# Patient Record
Sex: Female | Born: 1955 | Race: Black or African American | Hispanic: No | Marital: Married | Smoking: Former smoker
Health system: Southern US, Community
[De-identification: ages and names within clinical notes are randomized; demographics above are authoritative.]

## PROBLEM LIST (undated history)

## (undated) DIAGNOSIS — I10 Essential (primary) hypertension: Secondary | ICD-10-CM

## (undated) HISTORY — DX: Essential (primary) hypertension: I10

---

## 1998-03-26 ENCOUNTER — Ambulatory Visit (HOSPITAL_COMMUNITY): Admission: RE | Admit: 1998-03-26 | Discharge: 1998-03-26 | Payer: Self-pay | Admitting: Internal Medicine

## 1999-02-25 ENCOUNTER — Encounter (HOSPITAL_COMMUNITY): Admission: RE | Admit: 1999-02-25 | Discharge: 1999-05-26 | Payer: Self-pay | Admitting: Obstetrics

## 1999-02-25 ENCOUNTER — Other Ambulatory Visit: Admission: RE | Admit: 1999-02-25 | Discharge: 1999-02-25 | Payer: Self-pay | Admitting: Obstetrics

## 1999-03-29 ENCOUNTER — Inpatient Hospital Stay (HOSPITAL_COMMUNITY): Admission: AD | Admit: 1999-03-29 | Discharge: 1999-03-29 | Payer: Self-pay | Admitting: Obstetrics

## 2001-11-22 ENCOUNTER — Inpatient Hospital Stay (HOSPITAL_COMMUNITY): Admission: RE | Admit: 2001-11-22 | Discharge: 2001-11-26 | Payer: Self-pay | Admitting: Obstetrics

## 2001-11-22 ENCOUNTER — Encounter (INDEPENDENT_AMBULATORY_CARE_PROVIDER_SITE_OTHER): Payer: Self-pay | Admitting: Specialist

## 2003-04-18 ENCOUNTER — Ambulatory Visit (HOSPITAL_COMMUNITY): Admission: RE | Admit: 2003-04-18 | Discharge: 2003-04-18 | Payer: Self-pay | Admitting: Obstetrics

## 2003-04-18 ENCOUNTER — Encounter: Payer: Self-pay | Admitting: Obstetrics

## 2003-07-11 ENCOUNTER — Ambulatory Visit (HOSPITAL_COMMUNITY): Admission: RE | Admit: 2003-07-11 | Discharge: 2003-07-11 | Payer: Self-pay | Admitting: Gastroenterology

## 2003-07-11 ENCOUNTER — Encounter (INDEPENDENT_AMBULATORY_CARE_PROVIDER_SITE_OTHER): Payer: Self-pay | Admitting: *Deleted

## 2004-06-04 ENCOUNTER — Encounter (HOSPITAL_COMMUNITY): Admission: RE | Admit: 2004-06-04 | Discharge: 2004-09-02 | Payer: Self-pay | Admitting: Internal Medicine

## 2004-10-31 ENCOUNTER — Encounter: Admission: RE | Admit: 2004-10-31 | Discharge: 2004-10-31 | Payer: Self-pay | Admitting: Gastroenterology

## 2009-12-18 ENCOUNTER — Ambulatory Visit: Payer: Self-pay | Admitting: Cardiology

## 2009-12-18 ENCOUNTER — Encounter: Payer: Self-pay | Admitting: Internal Medicine

## 2009-12-18 ENCOUNTER — Ambulatory Visit (HOSPITAL_COMMUNITY): Admission: RE | Admit: 2009-12-18 | Discharge: 2009-12-18 | Payer: Self-pay | Admitting: Internal Medicine

## 2010-11-15 ENCOUNTER — Encounter: Payer: Self-pay | Admitting: Gastroenterology

## 2010-11-16 ENCOUNTER — Encounter: Payer: Self-pay | Admitting: Gastroenterology

## 2011-02-05 ENCOUNTER — Ambulatory Visit (INDEPENDENT_AMBULATORY_CARE_PROVIDER_SITE_OTHER): Payer: BLUE CROSS/BLUE SHIELD | Admitting: Family Medicine

## 2011-02-05 DIAGNOSIS — Z79899 Other long term (current) drug therapy: Secondary | ICD-10-CM

## 2011-02-05 DIAGNOSIS — K5190 Ulcerative colitis, unspecified, without complications: Secondary | ICD-10-CM

## 2011-02-05 DIAGNOSIS — I10 Essential (primary) hypertension: Secondary | ICD-10-CM

## 2011-02-05 DIAGNOSIS — H109 Unspecified conjunctivitis: Secondary | ICD-10-CM

## 2011-03-13 NOTE — Discharge Summary (Signed)
[facility]  Patient:    Virginia Robles, Virginia Robles Visit Number: [id number] MRN: [id number]          Service Type: GYN Location: [id number] Attending Physician:  Venita Sheffield Dictated by:   Kathreen Cosier, M.D. Admit Date:  11/22/2001 Discharge Date: 11/26/2001                             Discharge Summary  HISTORY OF PRESENT ILLNESS:   The patient is a 55 year old, gravida 1, para 1, with a long history of dysfunctional bleeding, had a D&C done in the past, hormonal treatment, she had myomas, and was admitted for TAH/BSO.  ALLERGIES:                    PENICILLIN and SULFA.  MEDICATIONS:                  Norvasc, Zestril, Toprol, hydrochlorothiazide for increased blood pressure.  LABORATORY DATA:              On admission her hemoglobin was 12.1, white count 11.3, platelets 378,000. Sodium 141, potassium 4.6, chloride 105, CO2 24, glucose 93. Urine negative. She was O positive.  HOSPITAL COURSE:              The patient underwent TAH/BSO. Postoperatively, she did well except for slight temperature elevation. She was started on Cleocin 900 mg IV every eight hours. Postoperatively, her hemoglobin was 10.9. The patient did well but was kept an extra day because her blood pressure went up to 160/104. She was discharged on the fourth postoperative day, ambulatory on a regular diet.  DISCHARGE FOLLOWUP:           The patient is to see Dr. Gaynell Face in four weeks.  DISCHARGE MEDICATIONS:        Her antihypertensives as prescribed plus Tylox one p.o. q.3-4h. p.r.n.  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 Dictated by:   Kathreen Cosier, M.D. Attending Physician:  Venita Sheffield DD:  12/08/01 TD:  12/08/01 Job: [id number]

## 2011-03-13 NOTE — Op Note (Signed)
NAME:  Virginia Robles, Virginia Robles                       ACCOUNT NO.:  [id number]   MEDICAL RECORD NO.:  [id number]                   PATIENT TYPE:  AMB   LOCATION:  ENDO                                 FACILITY:  [facility]   PHYSICIAN:  Anselmo Rod, M.D.               DATE OF BIRTH:  1956-07-31   DATE OF PROCEDURE:  07/11/2003  DATE OF DISCHARGE:                                 OPERATIVE REPORT   PROCEDURE PERFORMED:  Colonoscopy with multiple biopsies from the right,  transverse, and left colon.   ENDOSCOPIST:  Anselmo Rod, M.D.   INSTRUMENT USED:  Olympus video colonoscope.   INDICATION FOR PROCEDURE:  A 55 year old African-American female with a  history of ulcerative colitis presenting with rectal bleeding, worsening  diarrhea, undergoing a colonoscopy to evaluate the extent of disease.   PREPROCEDURE PREPARATION:  Informed consent was procured from the patient.  The patient was fasted for eight hours prior to the procedure and prepped  with a bottle of magnesium citrate and a gallon of GoLYTELY the night prior  to the procedure.   PREPROCEDURE PHYSICAL:  VITAL SIGNS:  The patient had stable vital signs.  NECK:  Supple.  CHEST:  Clear to auscultation.  S1, S2 regular.  ABDOMEN:  Soft with normal bowel sounds.   DESCRIPTION OF PROCEDURE:  The patient was placed in the left lateral  decubitus position and sedated with 50 mg of Demerol and 5 mg of Versed  intravenously.  Once the patient was adequately sedate and maintained on low-  flow oxygen and continuous cardiac monitoring, the Olympus video colonoscope  was advanced from the rectum to the cecum without difficulty.  The patient  had some residual stool in the rectosigmoid area, and multiple washes were  done.  The procedure was completed up to the terminal ileum.  The  appendiceal orifice and the ileocecal valve were clearly visualized and  photographed.  There was evidence of severe inflammatory change with  pseudopolyps  of varying sizes throughout the colon up to the cecum.  Multiple biopsies were done from the cecum, right colon, transverse colon,  and left colon.  Retroflexion in the rectum revealed no abnormalities.  The  patient tolerated the procedure well without complications.  The terminal  ileum appeared normal.   IMPRESSION:  1. 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  2. Normal-appearing terminal ileum.   RECOMMENDATIONS:  1. Await pathology results.  2.     Possible surgical evaluation once the pathology results have been procured.   3. Avoid nonsteroidals.  4. Outpatient follow-up in the next two weeks for further recommendation.  Anselmo Rod, M.D.    JNM/MEDQ  D:  07/11/2003  T:  07/12/2003  Job:  [id number]   cc:   Gabriel Earing, M.D.  [address]  [place]  [place] [address]  Fax: [phone]

## 2011-03-13 NOTE — Op Note (Signed)
[facility]  Patient:    Virginia Robles, Virginia Robles Visit Number: [id number] MRN: [id number]          Service Type: GYN Location: [phone] Attending Physician:  Venita Sheffield Dictated by:   Kathreen Cosier, M.D. Proc. Date: 11/22/01 Admit Date:  11/22/2001                             Operative Report  PREOPERATIVE DIAGNOSES:       1. Myomas.                               2. Dysfunctional uterine bleeding.  POSTOPERATIVE DIAGNOSES:      1. Myomas.                               2. Dysfunctional uterine bleeding.                               3. Multiple small bowel adhesions to the                                  anterior abdominal wall.  SURGEONS:                     1. Kathreen Cosier, M.D.                               2. Mardene Celeste. Lurene Shadow, M.D.  DESCRIPTION OF PROCEDURE:     The patient was placed on the operating table in the supine position.  General anesthesia was administered.  The abdomen was prepped and draped.  The bladder was emptied with a Foley catheter.  A transverse suprapubic incision was made and carried down to the rectus fascia. The fascia was cleanly incised for the length of the incision.  The rectus muscles were retracted laterally.  The peritoneum was incised longitudinally. It was noted that she had multiple myomas.  The ovaries and tubes were normal. he right round ligament was grasped with a Kelly clamp, cut through and ligated with #1 chromic.   The preoperative diagnosis was done in a similar fashion on the other side.  The right infundibulopelvic ligament was grasped with a Kelly, cut and suture ligated.  This was done in a similar fashion on the other side.  The bladder was dissected off of the cervix with Metzenbaum scissors.  The uterine vessels were skeletonized bilaterally, doubly clamped in the right with Heaney clamps, cut and suture ligated x 2.  This was done in a similar fashion on the  other side.  The cardinal and uterosacral ligaments were grasped with a straight Kocher on the right, cut and suture ligated with #1 chromic.  This was done in a similar fashion on the other side.  The specimen consisting of the uterus, tubes and ovaries was removed from the cervicovaginal junction.  Modified Richardson sutures were placed in the angles of the vagina, followed by an interlocking suture of #1 chromic.  The vault was left open.  The operative site was reperitonealized with 2-0 chromic.  Hemostasis was satisfactory.  On preparation for closure  of the abdominal incision, it was noted that she had a large amount of small bowel near the umbilicus.  This was attached to the old midline scar.  Using Metzenbaum scissors, these adhesions were lysed.  Any serosal tears were closed by Dr. Lurene Shadow wth 3-0 silk.  The abdomen was closed in layers, the peritoneum with continuous suture of 0 chromic, the fascia with a continuous suture of 0 Dexon and the skin closed with subcuticular suture of 3-0 plain. Blood loss was 150 cc.  The patient tolerated the procedure well and was taken to the recovery room in good condition. Dictated by:   Kathreen Cosier, M.D. Attending Physician:  Venita Sheffield DD:  11/22/01 TD:  11/22/01 Job: [id number]

## 2011-08-06 ENCOUNTER — Encounter: Payer: Self-pay | Admitting: Family Medicine

## 2011-08-25 ENCOUNTER — Other Ambulatory Visit (HOSPITAL_COMMUNITY): Payer: Self-pay | Admitting: Obstetrics

## 2011-08-25 DIAGNOSIS — Z8051 Family history of malignant neoplasm of kidney: Secondary | ICD-10-CM

## 2011-08-25 DIAGNOSIS — Z1231 Encounter for screening mammogram for malignant neoplasm of breast: Secondary | ICD-10-CM

## 2011-09-30 ENCOUNTER — Ambulatory Visit (HOSPITAL_COMMUNITY)
Admission: RE | Admit: 2011-09-30 | Discharge: 2011-09-30 | Disposition: A | Discharge status: Home / Self Care | Payer: BC Managed Care – PPO | Source: Ambulatory Visit | Attending: Obstetrics | Admitting: Obstetrics

## 2011-09-30 ENCOUNTER — Ambulatory Visit (HOSPITAL_COMMUNITY): Admission: RE | Admit: 2011-09-30 | Payer: BC Managed Care – PPO | Source: Ambulatory Visit

## 2011-09-30 DIAGNOSIS — Z1231 Encounter for screening mammogram for malignant neoplasm of breast: Secondary | ICD-10-CM

## 2012-11-14 ENCOUNTER — Other Ambulatory Visit (HOSPITAL_COMMUNITY): Payer: Self-pay | Admitting: Internal Medicine

## 2012-11-14 DIAGNOSIS — Z1231 Encounter for screening mammogram for malignant neoplasm of breast: Secondary | ICD-10-CM

## 2012-11-16 ENCOUNTER — Ambulatory Visit (HOSPITAL_COMMUNITY)
Admission: RE | Admit: 2012-11-16 | Discharge: 2012-11-16 | Disposition: A | Discharge status: Home / Self Care | Payer: BC Managed Care – PPO | Source: Ambulatory Visit | Attending: Internal Medicine | Admitting: Internal Medicine

## 2012-11-16 DIAGNOSIS — Z1231 Encounter for screening mammogram for malignant neoplasm of breast: Secondary | ICD-10-CM

## 2013-10-31 ENCOUNTER — Other Ambulatory Visit (HOSPITAL_COMMUNITY): Payer: Self-pay | Admitting: Internal Medicine

## 2013-10-31 DIAGNOSIS — Z1231 Encounter for screening mammogram for malignant neoplasm of breast: Secondary | ICD-10-CM

## 2013-11-21 ENCOUNTER — Ambulatory Visit (HOSPITAL_COMMUNITY)
Admission: RE | Admit: 2013-11-21 | Discharge: 2013-11-21 | Disposition: A | Discharge status: Home / Self Care | Payer: BC Managed Care – PPO | Source: Ambulatory Visit | Attending: Internal Medicine | Admitting: Internal Medicine

## 2013-11-21 DIAGNOSIS — Z1231 Encounter for screening mammogram for malignant neoplasm of breast: Secondary | ICD-10-CM

## 2014-11-05 ENCOUNTER — Ambulatory Visit (HOSPITAL_COMMUNITY): Payer: BC Managed Care – PPO

## 2014-11-05 ENCOUNTER — Other Ambulatory Visit (HOSPITAL_COMMUNITY): Payer: Self-pay | Admitting: Internal Medicine

## 2014-11-05 DIAGNOSIS — Z1231 Encounter for screening mammogram for malignant neoplasm of breast: Secondary | ICD-10-CM

## 2014-12-06 ENCOUNTER — Ambulatory Visit (HOSPITAL_COMMUNITY): Payer: BC Managed Care – PPO

## 2014-12-12 ENCOUNTER — Ambulatory Visit (HOSPITAL_COMMUNITY)
Admission: RE | Admit: 2014-12-12 | Discharge: 2014-12-12 | Disposition: A | Discharge status: Home / Self Care | Payer: BC Managed Care – PPO | Source: Ambulatory Visit | Attending: Internal Medicine | Admitting: Internal Medicine

## 2014-12-12 DIAGNOSIS — Z1231 Encounter for screening mammogram for malignant neoplasm of breast: Secondary | ICD-10-CM | POA: Diagnosis not present

## 2015-11-25 ENCOUNTER — Other Ambulatory Visit: Payer: Self-pay

## 2015-11-25 DIAGNOSIS — Z1231 Encounter for screening mammogram for malignant neoplasm of breast: Secondary | ICD-10-CM

## 2015-12-16 ENCOUNTER — Ambulatory Visit
Admission: RE | Admit: 2015-12-16 | Discharge: 2015-12-16 | Disposition: A | Discharge status: Home / Self Care | Payer: BC Managed Care – PPO | Source: Ambulatory Visit

## 2015-12-16 DIAGNOSIS — Z1231 Encounter for screening mammogram for malignant neoplasm of breast: Secondary | ICD-10-CM

## 2016-12-18 ENCOUNTER — Other Ambulatory Visit: Payer: Self-pay | Admitting: Internal Medicine

## 2016-12-18 DIAGNOSIS — Z1231 Encounter for screening mammogram for malignant neoplasm of breast: Secondary | ICD-10-CM

## 2017-01-06 ENCOUNTER — Ambulatory Visit
Admission: RE | Admit: 2017-01-06 | Discharge: 2017-01-06 | Disposition: A | Discharge status: Home / Self Care | Payer: BC Managed Care – PPO | Source: Ambulatory Visit | Attending: Internal Medicine | Admitting: Internal Medicine

## 2017-01-06 DIAGNOSIS — Z1231 Encounter for screening mammogram for malignant neoplasm of breast: Secondary | ICD-10-CM

## 2017-12-22 ENCOUNTER — Other Ambulatory Visit: Payer: Self-pay | Admitting: Internal Medicine

## 2017-12-22 DIAGNOSIS — Z1231 Encounter for screening mammogram for malignant neoplasm of breast: Secondary | ICD-10-CM

## 2018-01-07 ENCOUNTER — Ambulatory Visit: Payer: BC Managed Care – PPO

## 2018-01-14 ENCOUNTER — Ambulatory Visit
Admission: RE | Admit: 2018-01-14 | Discharge: 2018-01-14 | Disposition: A | Discharge status: Home / Self Care | Payer: BC Managed Care – PPO | Source: Ambulatory Visit | Attending: Internal Medicine | Admitting: Internal Medicine

## 2018-01-14 DIAGNOSIS — Z1231 Encounter for screening mammogram for malignant neoplasm of breast: Secondary | ICD-10-CM

## 2018-02-23 ENCOUNTER — Ambulatory Visit: Payer: BC Managed Care – PPO | Admitting: Family Medicine

## 2018-02-23 ENCOUNTER — Encounter: Payer: Self-pay | Admitting: Family Medicine

## 2018-02-23 VITALS — BP 142/80 | HR 102 | Temp 98.1°F | Wt 187.0 lb

## 2018-02-23 DIAGNOSIS — Z7689 Persons encountering health services in other specified circumstances: Secondary | ICD-10-CM

## 2018-02-23 DIAGNOSIS — I10 Essential (primary) hypertension: Secondary | ICD-10-CM

## 2018-02-23 NOTE — Patient Instructions (Addendum)
Today we discussed taking your lisinopril 40 mg tablets and breaking them in half.  This means you will start taking 20 mg (1/2 tab) daily.  You should continue checking her blood pressure at home.  If your blood pressure readings are greater than 140/80 consistently please feel free to contact the clinic so that we may send in a different blood pressure medication to your pharmacy.   DASH Eating Plan DASH stands for "Dietary Approaches to Stop Hypertension." The DASH eating plan is a healthy eating plan that has been shown to reduce high blood pressure (hypertension). It may also reduce your risk for type 2 diabetes, heart disease, and stroke. The DASH eating plan may also help with weight loss. What are tips for following this plan? General guidelines  Avoid eating more than 2,300 mg (milligrams) of salt (sodium) a day. If you have hypertension, you may need to reduce your sodium intake to 1,500 mg a day.  Limit alcohol intake to no more than 1 drink a day for nonpregnant women and 2 drinks a day for men. One drink equals 12 oz of beer, 5 oz of wine, or 1 oz of hard liquor.  Work with your health care provider to maintain a healthy body weight or to lose weight. Ask what an ideal weight is for you.  Get at least 30 minutes of exercise that causes your heart to beat faster (aerobic exercise) most days of the week. Activities may include walking, swimming, or biking.  Work with your health care provider or diet and nutrition specialist (dietitian) to adjust your eating plan to your individual calorie needs. Reading food labels  Check food labels for the amount of sodium per serving. Choose foods with less than 5 percent of the Daily Value of sodium. Generally, foods with less than 300 mg of sodium per serving fit into this eating plan.  To find whole grains, look for the word "whole" as the first word in the ingredient list. Shopping  Buy products labeled as "low-sodium" or "no salt  added."  Buy fresh foods. Avoid canned foods and premade or frozen meals. Cooking  Avoid adding salt when cooking. Use salt-free seasonings or herbs instead of table salt or sea salt. Check with your health care provider or pharmacist before using salt substitutes.  Do not fry foods. Cook foods using healthy methods such as baking, boiling, grilling, and broiling instead.  Cook with heart-healthy oils, such as olive, canola, soybean, or sunflower oil. Meal planning   Eat a balanced diet that includes: ? 5 or more servings of fruits and vegetables each day. At each meal, try to fill half of your plate with fruits and vegetables. ? Up to 6-8 servings of whole grains each day. ? Less than 6 oz of lean meat, poultry, or fish each day. A 3-oz serving of meat is about the same size as a deck of cards. One egg equals 1 oz. ? 2 servings of low-fat dairy each day. ? A serving of nuts, seeds, or beans 5 times each week. ? Heart-healthy fats. Healthy fats called Omega-3 fatty acids are found in foods such as flaxseeds and coldwater fish, like sardines, salmon, and mackerel.  Limit how much you eat of the following: ? Canned or prepackaged foods. ? Food that is high in trans fat, such as fried foods. ? Food that is high in saturated fat, such as fatty meat. ? Sweets, desserts, sugary drinks, and other foods with added sugar. ? Full-fat dairy  products.  Do not salt foods before eating.  Try to eat at least 2 vegetarian meals each week.  Eat more home-cooked food and less restaurant, buffet, and fast food.  When eating at a restaurant, ask that your food be prepared with less salt or no salt, if possible. What foods are recommended? The items listed may not be a complete list. Talk with your dietitian about what dietary choices are best for you. Grains Whole-grain or whole-wheat bread. Whole-grain or whole-wheat pasta. Brown rice. [place]. Bulgur. Whole-grain and low-sodium cereals.  Pita bread. Low-fat, low-sodium crackers. Whole-wheat flour tortillas. Vegetables Fresh or frozen vegetables (raw, steamed, roasted, or grilled). Low-sodium or reduced-sodium tomato and vegetable juice. Low-sodium or reduced-sodium tomato sauce and tomato paste. Low-sodium or reduced-sodium canned vegetables. Fruits All fresh, dried, or frozen fruit. Canned fruit in natural juice (without added sugar). Meat and other protein foods Skinless chicken or [place]. Ground chicken or [place]. Pork with fat trimmed off. Fish and seafood. Egg whites. Dried beans, peas, or lentils. Unsalted nuts, nut butters, and seeds. Unsalted canned beans. Lean cuts of beef with fat trimmed off. Low-sodium, lean deli meat. Dairy Low-fat (1%) or fat-free (skim) milk. Fat-free, low-fat, or reduced-fat cheeses. Nonfat, low-sodium ricotta or cottage cheese. Low-fat or nonfat yogurt. Low-fat, low-sodium cheese. Fats and oils Soft margarine without trans fats. Vegetable oil. Low-fat, reduced-fat, or light mayonnaise and salad dressings (reduced-sodium). Canola, safflower, olive, soybean, and sunflower oils. Avocado. Seasoning and other foods Herbs. Spices. Seasoning mixes without salt. Unsalted popcorn and pretzels. Fat-free sweets. What foods are not recommended? The items listed may not be a complete list. Talk with your dietitian about what dietary choices are best for you. Grains Baked goods made with fat, such as croissants, muffins, or some breads. Dry pasta or rice meal packs. Vegetables Creamed or fried vegetables. Vegetables in a cheese sauce. Regular canned vegetables (not low-sodium or reduced-sodium). Regular canned tomato sauce and paste (not low-sodium or reduced-sodium). Regular tomato and vegetable juice (not low-sodium or reduced-sodium). Angie Fava. Olives. Fruits Canned fruit in a light or heavy syrup. Fried fruit. Fruit in cream or butter sauce. Meat and other protein foods Fatty cuts of meat. Ribs. Fried  meat. Berniece Salines. Sausage. Bologna and other processed lunch meats. Salami. Fatback. Hotdogs. Bratwurst. Salted nuts and seeds. Canned beans with added salt. Canned or smoked fish. Whole eggs or egg yolks. Chicken or [place] with skin. Dairy Whole or 2% milk, cream, and half-and-half. Whole or full-fat cream cheese. Whole-fat or sweetened yogurt. Full-fat cheese. Nondairy creamers. Whipped toppings. Processed cheese and cheese spreads. Fats and oils Butter. Stick margarine. Lard. Shortening. Ghee. Bacon fat. Tropical oils, such as coconut, palm kernel, or palm oil. Seasoning and other foods Salted popcorn and pretzels. Onion salt, garlic salt, seasoned salt, table salt, and sea salt. Worcestershire sauce. Tartar sauce. Barbecue sauce. Teriyaki sauce. Soy sauce, including reduced-sodium. Steak sauce. Canned and packaged gravies. Fish sauce. Oyster sauce. Cocktail sauce. Horseradish that you find on the shelf. Ketchup. Mustard. Meat flavorings and tenderizers. Bouillon cubes. Hot sauce and Tabasco sauce. Premade or packaged marinades. Premade or packaged taco seasonings. Relishes. Regular salad dressings. Where to find more information:  National Heart, Lung, and [place]: https://wilson-eaton.com/  American Heart Association: www.heart.org Summary  The DASH eating plan is a healthy eating plan that has been shown to reduce high blood pressure (hypertension). It may also reduce your risk for type 2 diabetes, heart disease, and stroke.  With the DASH eating plan, you should limit  salt (sodium) intake to 2,300 mg a day. If you have hypertension, you may need to reduce your sodium intake to 1,500 mg a day.  When on the DASH eating plan, aim to eat more fresh fruits and vegetables, whole grains, lean proteins, low-fat dairy, and heart-healthy fats.  Work with your health care provider or diet and nutrition specialist (dietitian) to adjust your eating plan to your individual calorie needs. This information is  not intended to replace advice given to you by your health care provider. Make sure you discuss any questions you have with your health care provider. Document Released: 10/01/2011 Document Revised: 10/05/2016 Document Reviewed: 10/05/2016 Elsevier Interactive Patient Education  2018 [employer].  How to Take Your Blood Pressure You can take your blood pressure at home with a machine. You may need to check your blood pressure at home:  To check if you have high blood pressure (hypertension).  To check your blood pressure over time.  To make sure your blood pressure medicine is working.  Supplies needed: You will need a blood pressure machine, or monitor. You can buy one at a drugstore or online. When choosing one:  Choose one with an arm cuff.  Choose one that wraps around your upper arm. Only one finger should fit between your arm and the cuff.  Do not choose one that measures your blood pressure from your wrist or finger.  Your doctor can suggest a monitor. How to prepare Avoid these things for 30 minutes before checking your blood pressure:  Drinking caffeine.  Drinking alcohol.  Eating.  Smoking.  Exercising.  Five minutes before checking your blood pressure:  Pee.  Sit in a dining chair. Avoid sitting in a soft couch or armchair.  Be quiet. Do not talk.  How to take your blood pressure Follow the instructions that came with your machine. If you have a digital blood pressure monitor, these may be the instructions: 1. Sit up straight. 2. Place your feet on the floor. Do not cross your ankles or legs. 3. Rest your left arm at the level of your heart. You may rest it on a table, desk, or chair. 4. Pull up your shirt sleeve. 5. Wrap the blood pressure cuff around the upper part of your left arm. The cuff should be 1 inch (2.5 cm) above your elbow. It is best to wrap the cuff around bare skin. 6. Fit the cuff snugly around your arm. You should be able to place only  one finger between the cuff and your arm. 7. Put the cord inside the groove of your elbow. 8. Press the power button. 9. Sit quietly while the cuff fills with air and loses air. 10. Write down the numbers on the screen. 11. Wait 2-3 minutes and then repeat steps 1-10.  What do the numbers mean? Two numbers make up your blood pressure. The first number is called systolic pressure. The second is called diastolic pressure. An example of a blood pressure reading is "120 over 80" (or 120/80). If you are an adult and do not have a medical condition, use this guide to find out if your blood pressure is normal: Normal  First number: below 120.  Second number: below 80. Elevated  First number: 120-129.  Second number: below 80. Hypertension stage 1  First number: 130-139.  Second number: 80-89. Hypertension stage 2  First number: 140 or above.  Second number: 90 or above. Your blood pressure is above normal even if only the  top or bottom number is above normal. Follow these instructions at home:  Check your blood pressure as often as your doctor tells you to.  Take your monitor to your next doctor's appointment. Your doctor will: ? Make sure you are using it correctly. ? Make sure it is working right.  Make sure you understand what your blood pressure numbers should be.  Tell your doctor if your medicines are causing side effects. Contact a doctor if:  Your blood pressure keeps being high. Get help right away if:  Your first blood pressure number is higher than 180.  Your second blood pressure number is higher than 120. This information is not intended to replace advice given to you by your health care provider. Make sure you discuss any questions you have with your health care provider. Document Released: 09/24/2008 Document Revised: 09/09/2016 Document Reviewed: 03/20/2016 Elsevier Interactive Patient Education  2018 [employer].  Managing Your  Hypertension Hypertension is commonly called high blood pressure. This is when the force of your blood pressing against the walls of your arteries is too strong. Arteries are blood vessels that carry blood from your heart throughout your body. Hypertension forces the heart to work harder to pump blood, and may cause the arteries to become narrow or stiff. Having untreated or uncontrolled hypertension can cause heart attack, stroke, kidney disease, and other problems. What are blood pressure readings? A blood pressure reading consists of a higher number over a lower number. Ideally, your blood pressure should be below 120/80. The first ("top") number is called the systolic pressure. It is a measure of the pressure in your arteries as your heart beats. The second ("bottom") number is called the diastolic pressure. It is a measure of the pressure in your arteries as the heart relaxes. What does my blood pressure reading mean? Blood pressure is classified into four stages. Based on your blood pressure reading, your health care provider may use the following stages to determine what type of treatment you need, if any. Systolic pressure and diastolic pressure are measured in a unit called mm Hg. Normal  Systolic pressure: below 120.  Diastolic pressure: below 80. Elevated  Systolic pressure: 120-129.  Diastolic pressure: below 80. Hypertension stage 1  Systolic pressure: 130-139.  Diastolic pressure: 80-89. Hypertension stage 2  Systolic pressure: 140 or above.  Diastolic pressure: 90 or above. What health risks are associated with hypertension? Managing your hypertension is an important responsibility. Uncontrolled hypertension can lead to:  A heart attack.  A stroke.  A weakened blood vessel (aneurysm).  Heart failure.  Kidney damage.  Eye damage.  Metabolic syndrome.  Memory and concentration problems.  What changes can I make to manage my hypertension? Hypertension can be  managed by making lifestyle changes and possibly by taking medicines. Your health care provider will help you make a plan to bring your blood pressure within a normal range. Eating and drinking  Eat a diet that is high in fiber and potassium, and low in salt (sodium), added sugar, and fat. An example eating plan is called the DASH (Dietary Approaches to Stop Hypertension) diet. To eat this way: ? Eat plenty of fresh fruits and vegetables. Try to fill half of your plate at each meal with fruits and vegetables. ? Eat whole grains, such as whole wheat pasta, brown rice, or whole grain bread. Fill about one quarter of your plate with whole grains. ? Eat low-fat diary products. ? Avoid fatty cuts of meat, processed or cured  meats, and poultry with skin. Fill about one quarter of your plate with lean proteins such as fish, chicken without skin, beans, eggs, and tofu. ? Avoid premade and processed foods. These tend to be higher in sodium, added sugar, and fat.  Reduce your daily sodium intake. Most people with hypertension should eat less than 1,500 mg of sodium a day.  Limit alcohol intake to no more than 1 drink a day for nonpregnant women and 2 drinks a day for men. One drink equals 12 oz of beer, 5 oz of wine, or 1 oz of hard liquor. Lifestyle  Work with your health care provider to maintain a healthy body weight, or to lose weight. Ask what an ideal weight is for you.  Get at least 30 minutes of exercise that causes your heart to beat faster (aerobic exercise) most days of the week. Activities may include walking, swimming, or biking.  Include exercise to strengthen your muscles (resistance exercise), such as weight lifting, as part of your weekly exercise routine. Try to do these types of exercises for 30 minutes at least 3 days a week.  Do not use any products that contain nicotine or tobacco, such as cigarettes and e-cigarettes. If you need help quitting, ask your health care  provider.  Control any long-term (chronic) conditions you have, such as high cholesterol or diabetes. Monitoring  Monitor your blood pressure at home as told by your health care provider. Your personal target blood pressure may vary depending on your medical conditions, your age, and other factors.  Have your blood pressure checked regularly, as often as told by your health care provider. Working with your health care provider  Review all the medicines you take with your health care provider because there may be side effects or interactions.  Talk with your health care provider about your diet, exercise habits, and other lifestyle factors that may be contributing to hypertension.  Visit your health care provider regularly. Your health care provider can help you create and adjust your plan for managing hypertension. Will I need medicine to control my blood pressure? Your health care provider may prescribe medicine if lifestyle changes are not enough to get your blood pressure under control, and if:  Your systolic blood pressure is 130 or higher.  Your diastolic blood pressure is 80 or higher.  Take medicines only as told by your health care provider. Follow the directions carefully. Blood pressure medicines must be taken as prescribed. The medicine does not work as well when you skip doses. Skipping doses also puts you at risk for problems. Contact a health care provider if:  You think you are having a reaction to medicines you have taken.  You have repeated (recurrent) headaches.  You feel dizzy.  You have swelling in your ankles.  You have trouble with your vision. Get help right away if:  You develop a severe headache or confusion.  You have unusual weakness or numbness, or you feel faint.  You have severe pain in your chest or abdomen.  You vomit repeatedly.  You have trouble breathing. Summary  Hypertension is when the force of blood pumping through your arteries is  too strong. If this condition is not controlled, it may put you at risk for serious complications.  Your personal target blood pressure may vary depending on your medical conditions, your age, and other factors. For most people, a normal blood pressure is less than 120/80.  Hypertension is managed by lifestyle changes, medicines, or both.  Lifestyle changes include weight loss, eating a healthy, low-sodium diet, exercising more, and limiting alcohol. This information is not intended to replace advice given to you by your health care provider. Make sure you discuss any questions you have with your health care provider. Document Released: 07/06/2012 Document Revised: 09/09/2016 Document Reviewed: 09/09/2016 Elsevier Interactive Patient Education  [employer]2018 [employer]

## 2018-02-23 NOTE — Progress Notes (Signed)
Patient presents to clinic today to establish care.  SUBJECTIVE: PMH: Pt is a 62 yo female with pmh sig for HTN.  Pt was previously seen at [facility] on [address]  HTN: -pt taking lisinopril 40 mg qod or every 3 days as she felt it was "too strong" caused HAs -in the past pt was also on HCTZ 12.5 mg -pt checking bp at home.  Typically 135/85 -Pt endorses drinking 5-6 bottles of water per day -Pt is also exercising at the [facility] center Monday through Thursday -Pt denies eating red meat and pork.  Allergies: Penicillin-hives Sulfa-hives  Past surgical history: Appendectomy 1975 Adhesion removal in abdomen 1976 Hysterectomy 2/2 heavy menses 1993  Social history: Patient is married.  She has a son who is 9.  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  Patient endorses social alcohol use.  Patient denies tobacco use though she smoked in the past.  Patient also denies drug use.  Family medical history: Mom-alive, depression, MI, HLD, HTN, kidney disease Dad-deceased, alcohol abuse, diabetes, HLD, stroke Sister-Francis-deceased, prior pancreatic cancer, HLD, HTN Sister-Nancy, alive, COPD, HTN, HLD Sister-Janet, alive, arthritis, HLD, HTN Brother-Joseph, alive, drug abuse, HTN  Health Maintenance: Dental -- Dr. Bradd Canary Immunizations --influenza vaccine 2018, tetanus shot 2018 Colonoscopy --2018 Mammogram --2019 PAP --2015?   Past Medical History:  Diagnosis Date  . Hypertension   . Ulcerative colitis     History reviewed. No pertinent surgical history.  Current Outpatient Medications on File Prior to Visit  Medication Sig Dispense Refill  . felodipine (PLENDIL) 10 MG 24 hr tablet Take 10 mg by mouth daily.      . hydrochlorothiazide (MICROZIDE) 12.5 MG capsule Take 12.5 mg by mouth daily.      Marland Kitchen lisinopril (PRINIVIL,ZESTRIL) 40 MG tablet Take 40 mg by mouth daily.      No current facility-administered medications  on file prior to visit.     Allergies  Allergen Reactions  . Penicillins Hives  . Sulfur Hives    Family History  Problem Relation Age of Onset  . Hypertension Mother   . Cancer Mother   . Diabetes Father   . Stroke Sister   . Hypertension Sister   . Hyperlipidemia Sister   . Cancer Sister   . Irritable bowel syndrome Sister     Social History   Socioeconomic History  . Marital status: Married    Spouse name: Not on file  . Number of children: Not on file  . Years of education: Not on file  . Highest education level: Not on file  Occupational History  . Not on file  Social Needs  . Financial resource strain: Not on file  . Food insecurity:    Worry: Not on file    Inability: Not on file  . Transportation needs:    Medical: Not on file    Non-medical: Not on file  Tobacco Use  . Smoking status: Not on file  Substance and Sexual Activity  . Alcohol use: Not on file  . Drug use: Not on file  . Sexual activity: Not on file  Lifestyle  . Physical activity:    Days per week: Not on file    Minutes per session: Not on file  . Stress: Not on file  Relationships  . Social connections:    Talks on phone: Not on file    Gets together: Not on file    Attends religious service:  Not on file    Active member of club or organization: Not on file    Attends meetings of clubs or organizations: Not on file    Relationship status: Not on file  . Intimate partner violence:    Fear of current or ex partner: Not on file    Emotionally abused: Not on file    Physically abused: Not on file    Forced sexual activity: Not on file  Other Topics Concern  . Not on file  Social History Narrative  . Not on file    ROS General: Denies fever, chills, night sweats, changes in weight, changes in appetite HEENT: Denies headaches, ear pain, changes in vision, rhinorrhea, sore throat CV: Denies CP, palpitations, SOB, orthopnea Pulm: Denies SOB, cough, wheezing GI: Denies abdominal  pain, nausea, vomiting, diarrhea, constipation GU: Denies dysuria, hematuria, frequency, vaginal discharge Msk: Denies muscle cramps, joint pains Neuro: Denies weakness, numbness, tingling Skin: Denies rashes, bruising Psych: Denies depression, anxiety, hallucinations  BP (!) 142/80 (BP Location: Left Arm, Patient Position: Sitting, Cuff Size: Normal)   Pulse (!) 102   Temp 98.1 F (36.7 C) (Oral)   Wt 187 lb (84.8 kg)   SpO2 97%   Physical Exam Gen. Pleasant, well developed, well-nourished, in NAD HEENT - [place]/AT, PERRL, no scleral icterus, no nasal drainage, pharynx without erythema or exudate.  TMs normal bilaterally.  No cervical lymphadenopathy. Neck: No JVD, no thyromegaly, no carotid bruits Lungs: no use of accessory muscles, CTAB, no wheezes, rales or rhonchi Cardiovascular: RRR, No r/g/m, no peripheral edema Abdomen: BS present, soft, nontender,nondistended Neuro:  A&Ox3, CN II-XII intact, normal gait Skin:  Warm, dry, intact, no lesions Psych: normal affect, mood appropriate  No results found for this or any previous visit (from the past 2160 hour(s)).  Assessment/Plan: Essential hypertension -Patient to take lisinopril 20 mg daily.  She just got a new prescription of her 40 mg tablets so she will break them in half and take one half (20 mg) daily. -Patient advised to check blood pressure twice a day.  If she has readings that are consistently greater than 140/80 she is to inform the clinic so that further adjustments to her blood pressure medicine can be made such as discontinuing lisinopril and starting a beta-blocker or an ARB. -Patient given handout on DASH diet.  Encounter to establish care -We reviewed the PMH, PSH, FH, SH, Meds and Allergies. -We provided refills for any medications we will prescribe as needed. -We addressed current concerns per orders and patient instructions. -We have asked for records for pertinent exams, studies, vaccines and notes from  previous providers. -We have advised patient to follow up per instructions below.   Patient to follow-up in the next 2 to 3 months for BP, sooner if needed  Abbe Amsterdam, MD

## 2018-07-25 ENCOUNTER — Other Ambulatory Visit: Payer: Self-pay | Admitting: Nurse Practitioner

## 2018-07-25 DIAGNOSIS — R1084 Generalized abdominal pain: Secondary | ICD-10-CM

## 2018-07-25 DIAGNOSIS — R630 Anorexia: Secondary | ICD-10-CM

## 2018-07-25 DIAGNOSIS — R05 Cough: Secondary | ICD-10-CM

## 2018-07-25 DIAGNOSIS — E559 Vitamin D deficiency, unspecified: Secondary | ICD-10-CM

## 2018-07-25 DIAGNOSIS — E785 Hyperlipidemia, unspecified: Secondary | ICD-10-CM

## 2018-07-25 DIAGNOSIS — I10 Essential (primary) hypertension: Secondary | ICD-10-CM

## 2018-07-26 LAB — COMPREHENSIVE METABOLIC PANEL
ALBUMIN: 4.2 g/dL (ref 3.6–4.8)
ALT: 72 IU/L — AB (ref 0–32)
AST: 60 IU/L — ABNORMAL HIGH (ref 0–40)
Albumin/Globulin Ratio: 1.1 — ABNORMAL LOW (ref 1.2–2.2)
Alkaline Phosphatase: 63 IU/L (ref 39–117)
BILIRUBIN TOTAL: 0.7 mg/dL (ref 0.0–1.2)
BUN/Creatinine Ratio: 16 (ref 12–28)
BUN: 13 mg/dL (ref 8–27)
CALCIUM: 10.7 mg/dL — AB (ref 8.7–10.3)
CO2: 22 mmol/L (ref 20–29)
CREATININE: 0.79 mg/dL (ref 0.57–1.00)
Chloride: 105 mmol/L (ref 96–106)
GFR calc Af Amer: 92 mL/min/{1.73_m2} (ref >59)
GFR, EST NON AFRICAN AMERICAN: 80 mL/min/{1.73_m2} (ref >59)
GLUCOSE: 87 mg/dL (ref 65–99)
Globulin, Total: 3.7 g/dL (ref 1.5–4.5)
Potassium: 4.6 mmol/L (ref 3.5–5.2)
SODIUM: 139 mmol/L (ref 134–144)
TOTAL PROTEIN: 7.9 g/dL (ref 6.0–8.5)

## 2018-07-26 LAB — LIPID PANEL WITH LDL/HDL RATIO
Cholesterol, Total: 178 mg/dL (ref 100–199)
HDL: 39 mg/dL — ABNORMAL LOW (ref >39)
LDL CALC: 119 mg/dL — AB (ref 0–99)
LDL/HDL RATIO: 3.1 ratio (ref 0.0–3.2)
Triglycerides: 102 mg/dL (ref 0–149)
VLDL Cholesterol Cal: 20 mg/dL (ref 5–40)

## 2018-07-26 LAB — H PYLORI, IGM, IGG, IGA AB: H. pylori, IgG AbS: 0.31 Index Value (ref 0.00–0.79)

## 2018-07-26 LAB — VITAMIN D 25 HYDROXY (VIT D DEFICIENCY, FRACTURES): Vit D, 25-Hydroxy: 40.2 ng/mL (ref 30.0–100.0)

## 2018-08-02 LAB — HEPATIC FUNCTION PANEL
ALT: 66 IU/L — ABNORMAL HIGH (ref 0–32)
AST: 58 IU/L — AB (ref 0–40)
Albumin: 3.9 g/dL (ref 3.6–4.8)
Alkaline Phosphatase: 58 IU/L (ref 39–117)
BILIRUBIN, DIRECT: 0.04 mg/dL (ref 0.00–0.40)
Total Protein: 7.7 g/dL (ref 6.0–8.5)

## 2018-08-02 LAB — SPECIMEN STATUS REPORT

## 2018-08-04 NOTE — Progress Notes (Signed)
Are you having any abdominal pain? I would like for you to drink lemon water, eat more broccoli and cauliflower. Avoid alcohol and NSAIDs and come back in 3 months for a rechek

## 2018-09-14 ENCOUNTER — Ambulatory Visit: Payer: Self-pay | Admitting: Nurse Practitioner

## 2018-09-15 ENCOUNTER — Other Ambulatory Visit: Payer: Self-pay

## 2018-09-15 MED ORDER — LISINOPRIL 40 MG PO TABS: 40.0000 mg | ORAL_TABLET | Freq: Every day | ORAL | 0 refills | Status: DC

## 2018-11-09 ENCOUNTER — Encounter: Payer: Self-pay | Admitting: Nurse Practitioner

## 2018-11-09 ENCOUNTER — Ambulatory Visit: Payer: BC Managed Care – PPO | Admitting: Nurse Practitioner

## 2018-11-09 VITALS — BP 132/78 | HR 78 | Temp 98.4°F | Ht 67.8 in | Wt 189.6 lb

## 2018-11-09 DIAGNOSIS — R7989 Other specified abnormal findings of blood chemistry: Secondary | ICD-10-CM

## 2018-11-09 DIAGNOSIS — R221 Localized swelling, mass and lump, neck: Secondary | ICD-10-CM | POA: Diagnosis not present

## 2018-11-09 DIAGNOSIS — Z1159 Encounter for screening for other viral diseases: Secondary | ICD-10-CM

## 2018-11-09 DIAGNOSIS — R945 Abnormal results of liver function studies: Secondary | ICD-10-CM | POA: Diagnosis not present

## 2018-11-09 DIAGNOSIS — Z23 Encounter for immunization: Secondary | ICD-10-CM

## 2018-11-09 DIAGNOSIS — I10 Essential (primary) hypertension: Secondary | ICD-10-CM | POA: Diagnosis not present

## 2018-11-09 DIAGNOSIS — E782 Mixed hyperlipidemia: Secondary | ICD-10-CM

## 2018-11-09 NOTE — Progress Notes (Addendum)
Subjective:     Patient ID: Virginia Robles , female    DOB: May 11, 1956 , 63 y.o.   MRN: [id number]   Chief Complaint  Patient presents with  . Hypertension  . Hyperlipidemia    HPI  Hypertension  This is a chronic problem. The current episode started more than 1 year ago. The problem is unchanged. The problem is controlled. Pertinent negatives include no anxiety, chest pain, headaches, malaise/fatigue or palpitations. There are no associated agents to hypertension. There are no known risk factors for coronary artery disease. Past treatments include ACE inhibitors. The current treatment provides moderate improvement. There are no compliance problems.  There is no history of angina, kidney disease or heart failure. There is no history of chronic renal disease.  Hyperlipidemia  This is a chronic problem. The current episode started more than 1 year ago. The problem is controlled. Recent lipid tests were reviewed and are high. She has no history of chronic renal disease. Pertinent negatives include no chest pain.     Past Medical History:  Diagnosis Date  . Hypertension   . Ulcerative colitis      Family History  Problem Relation Age of Onset  . Hypertension Mother   . Cancer Mother   . Diabetes Father   . Stroke Sister   . Hypertension Sister   . Hyperlipidemia Sister   . Cancer Sister   . Irritable bowel syndrome Sister      Current Outpatient Medications:  .  cholecalciferol (VITAMIN D3) 25 MCG (1000 UT) tablet, Take 1,000 Units by mouth daily., Disp: , Rfl:  .  lisinopril (PRINIVIL,ZESTRIL) 40 MG tablet, Take 1 tablet (40 mg total) by mouth daily., Disp: 90 tablet, Rfl: 0 .  mesalamine (LIALDA) 1.2 g EC tablet, Take 1.2 g by mouth daily with breakfast. Patient taking as needed, Disp: , Rfl:  .  Multiple Vitamins-Minerals (MULTIVITAMIN ADULT PO), Take 1 tablet by mouth daily., Disp: , Rfl:    Allergies  Allergen Reactions  . Penicillins Hives  . Sulfur Hives      Review of Systems  Constitutional: Negative for fatigue and malaise/fatigue.  HENT:       Bilateral swelling to neck area near shoulder, denies family history of non or hodgkins lymphoma  Eyes: Negative.   Respiratory: Negative for cough.   Cardiovascular: Negative.  Negative for chest pain, palpitations and leg swelling.  Endocrine: Negative for polydipsia, polyphagia and polyuria.  Musculoskeletal: Negative.   Neurological: Negative for dizziness and headaches.     Today's Vitals   11/09/18 1009  BP: 132/78  Pulse: 78  Temp: 98.4 F (36.9 C)  TempSrc: Oral  SpO2: 96%  Weight: 189 lb 9.6 oz (86 kg)  Height: 5' 7.8" (1.722 m)  PainSc: 0-No pain   Body mass index is 29 kg/m.   Objective:  Physical Exam Vitals signs reviewed.  Constitutional:      Appearance: Normal appearance.  Neck:     Musculoskeletal: Normal range of motion and neck supple. No muscular tenderness.     Comments: Bilateral "puffiness" to supraclavicular spaces soft Cardiovascular:     Rate and Rhythm: Normal rate and regular rhythm.     Pulses: Normal pulses.     Heart sounds: Normal heart sounds. No murmur.  Pulmonary:     Effort: Pulmonary effort is normal.     Breath sounds: Normal breath sounds. No wheezing.  Lymphadenopathy:     Cervical: No cervical adenopathy.  Skin:    General:  Skin is warm and dry.     Capillary Refill: Capillary refill takes less than 2 seconds.  Neurological:     General: No focal deficit present.     Mental Status: She is alert and oriented to person, place, and time.         Assessment And Plan:     1. Essential hypertension . B/P is controlled.  . CMP ordered to check renal function.  . The importance of regular exercise and dietary modification was stressed to the patient.  . Stressed importance of losing ten percent of her body weight to help with B/P control.  . The weight loss would help with decreasing cardiac and cancer risk as well.  - CBC with  Diff  2. Mixed hyperlipidemia  Chronic, controlled  Continue with current medications  - CMP14 + Anion Gap - CBC with Diff  3. Elevated liver function tests  Chronic, more elevated at last visit will recheck today  She has not had an ultrasound  I will also check hepatitis c panel - CMP14 + Anion Gap - Lipid panel - CBC with Diff  4. Need for influenza vaccination  Influenza vaccine administered  Encouraged to take Tylenol as needed for fever or muscle aches.  - Flu Vaccine QUAD 6+ mos PF IM (Fluarix Quad PF)  5. Encounter for hepatitis C screening test for low risk patient  - Hepatitis C antibody  6. Neck swelling  Bilateral supraclavicular swelling, soft  Will check CBC  For abnormal white cells.  Encouraged if have fever to return call to office  Arnette Felts, FNP

## 2018-11-09 NOTE — Patient Instructions (Addendum)
Influenza Virus Vaccine (Flucelvax) What is this medicine? INFLUENZA VIRUS VACCINE (in floo EN zuh VAHY ruhs vak SEEN) helps to reduce the risk of getting influenza also known as the flu. The vaccine only helps protect you against some strains of the flu. This medicine may be used for other purposes; ask your health care provider or pharmacist if you have questions. COMMON BRAND NAME(S): FLUCELVAX What should I tell my health care provider before I take this medicine? They need to know if you have any of these conditions: -bleeding disorder like hemophilia -fever or infection -Guillain-Barre syndrome or other neurological problems -immune system problems -infection with the human immunodeficiency virus (HIV) or AIDS -low blood platelet counts -multiple sclerosis -an unusual or allergic reaction to influenza virus vaccine, other medicines, foods, dyes or preservatives -pregnant or trying to get pregnant -breast-feeding How should I use this medicine? This vaccine is for injection into a muscle. It is given by a health care professional. A copy of Vaccine Information Statements will be given before each vaccination. Read this sheet carefully each time. The sheet may change frequently. Talk to your pediatrician regarding the use of this medicine in children. Special care may be needed. Overdosage: If you think you've taken too much of this medicine contact a poison control center or emergency room at once. Overdosage: If you think you have taken too much of this medicine contact a poison control center or emergency room at once. NOTE: This medicine is only for you. Do not share this medicine with others. What if I miss a dose? This does not apply. What may interact with this medicine? -chemotherapy or radiation therapy -medicines that lower your immune system like etanercept, anakinra, infliximab, and adalimumab -medicines that treat or prevent blood clots like  warfarin -phenytoin -steroid medicines like prednisone or cortisone -theophylline -vaccines This list may not describe all possible interactions. Give your health care provider a list of all the medicines, herbs, non-prescription drugs, or dietary supplements you use. Also tell them if you smoke, drink alcohol, or use illegal drugs. Some items may interact with your medicine. What should I watch for while using this medicine? Report any side effects that do not go away within 3 days to your doctor or health care professional. Call your health care provider if any unusual symptoms occur within 6 weeks of receiving this vaccine. You may still catch the flu, but the illness is not usually as bad. You cannot get the flu from the vaccine. The vaccine will not protect against colds or other illnesses that may cause fever. The vaccine is needed every year. What side effects may I notice from receiving this medicine? Side effects that you should report to your doctor or health care professional as soon as possible: -allergic reactions like skin rash, itching or hives, swelling of the face, lips, or tongue Side effects that usually do not require medical attention (Report these to your doctor or health care professional if they continue or are bothersome.): -fever -headache -muscle aches and pains -pain, tenderness, redness, or swelling at the injection site -tiredness This list may not describe all possible side effects. Call your doctor for medical advice about side effects. You may report side effects to FDA at 1-800-FDA-1088. Where should I keep my medicine? The vaccine will be given by a health care professional in a clinic, pharmacy, doctor's office, or other health care setting. You will not be given vaccine doses to store at home. NOTE: This sheet is a summary.   It may not cover all possible information. If you have questions about this medicine, talk to your doctor, pharmacist, or health care  provider.  2019 Elsevier/Gold Standard (2011-09-23 14:06:47)  Healthy Eating Following a healthy eating pattern may help you to achieve and maintain a healthy body weight, reduce the risk of chronic disease, and live a long and productive life. It is important to follow a healthy eating pattern at an appropriate calorie level for your body. Your nutritional needs should be met primarily through food by choosing a variety of nutrient-rich foods. What are tips for following this plan? Reading food labels  Read labels and choose the following: ? Reduced or low sodium. ? Juices with 100% fruit juice. ? Foods with low saturated fats and high polyunsaturated and monounsaturated fats. ? Foods with whole grains, such as whole wheat, cracked wheat, brown rice, and wild rice. ? Whole grains that are fortified with folic acid. This is recommended for women who are pregnant or who want to become pregnant.  Read labels and avoid the following: ? Foods with a lot of added sugars. These include foods that contain brown sugar, corn sweetener, corn syrup, dextrose, fructose, glucose, high-fructose corn syrup, honey, invert sugar, lactose, malt syrup, maltose, molasses, raw sugar, sucrose, trehalose, or turbinado sugar.  Do not eat more than the following amounts of added sugar per day:  6 teaspoons (25 g) for women.  9 teaspoons (38 g) for men. ? Foods that contain processed or refined starches and grains. ? Refined grain products, such as white flour, degermed cornmeal, white bread, and white rice. Shopping  Choose nutrient-rich snacks, such as vegetables, whole fruits, and nuts. Avoid high-calorie and high-sugar snacks, such as potato chips, fruit snacks, and candy.  Use oil-based dressings and spreads on foods instead of solid fats such as butter, stick margarine, or cream cheese.  Limit pre-made sauces, mixes, and "instant" products such as flavored rice, instant noodles, and ready-made  pasta.  Try more plant-protein sources, such as tofu, tempeh, black beans, edamame, lentils, nuts, and seeds.  Explore eating plans such as the Mediterranean diet or vegetarian diet. Cooking  Use oil to saut or stir-fry foods instead of solid fats such as butter, stick margarine, or lard.  Try baking, boiling, grilling, or broiling instead of frying.  Remove the fatty part of meats before cooking.  Steam vegetables in water or broth. Meal planning   At meals, imagine dividing your plate into fourths: ? One-half of your plate is fruits and vegetables. ? One-fourth of your plate is whole grains. ? One-fourth of your plate is protein, especially lean meats, poultry, eggs, tofu, beans, or nuts.  Include low-fat dairy as part of your daily diet. Lifestyle  Choose healthy options in all settings, including home, work, school, restaurants, or stores.  Prepare your food safely: ? Wash your hands after handling raw meats. ? Keep food preparation surfaces clean by regularly washing with hot, soapy water. ? Keep raw meats separate from ready-to-eat foods, such as fruits and vegetables. ? Cook seafood, meat, poultry, and eggs to the recommended internal temperature. ? Store foods at safe temperatures. In general:  Keep cold foods at 45F (4.4C) or below.  Keep hot foods at 145F (60C) or above.  Keep your freezer at [facility] (-17.8C) or below.  Foods are no longer safe to eat when they have been between the temperatures of 40-145F (4.4-60C) for more than 2 hours. What foods should I eat? Fruits Aim to eat 2  cup-equivalents of fresh, canned (in natural juice), or frozen fruits each day. Examples of 1 cup-equivalent of fruit include 1 small apple, 8 large strawberries, 1 cup canned fruit,  cup dried fruit, or 1 cup 100% juice. Vegetables Aim to eat 2-3 cup-equivalents of fresh and frozen vegetables each day, including different varieties and colors. Examples of 1 cup-equivalent of  vegetables include 2 medium carrots, 2 cups raw, leafy greens, 1 cup chopped vegetable (raw or cooked), or 1 medium baked potato. Grains Aim to eat 6 ounce-equivalents of whole grains each day. Examples of 1 ounce-equivalent of grains include 1 slice of bread, 1 cup ready-to-eat cereal, 3 cups popcorn, or  cup cooked rice, pasta, or cereal. Meats and other proteins Aim to eat 5-6 ounce-equivalents of protein each day. Examples of 1 ounce-equivalent of protein include 1 egg, 1/2 cup nuts or seeds, or 1 tablespoon (16 g) peanut butter. A cut of meat or fish that is the size of a deck of cards is about 3-4 ounce-equivalents.  Of the protein you eat each week, try to have at least 8 ounces come from seafood. This includes salmon, trout, herring, and anchovies. Dairy Aim to eat 3 cup-equivalents of fat-free or low-fat dairy each day. Examples of 1 cup-equivalent of dairy include 1 cup (240 mL) milk, 8 ounces (250 g) yogurt, 1 ounces (44 g) natural cheese, or 1 cup (240 mL) fortified soy milk. Fats and oils  Aim for about 5 teaspoons (21 g) per day. Choose monounsaturated fats, such as canola and olive oils, avocados, peanut butter, and most nuts, or polyunsaturated fats, such as sunflower, corn, and soybean oils, walnuts, pine nuts, sesame seeds, sunflower seeds, and flaxseed. Beverages  Aim for six 8-oz glasses of water per day. Limit coffee to three to five 8-oz cups per day.  Limit caffeinated beverages that have added calories, such as soda and energy drinks.  Limit alcohol intake to no more than 1 drink a day for nonpregnant women and 2 drinks a day for men. One drink equals 12 oz of beer (355 mL), 5 oz of wine (148 mL), or 1 oz of hard liquor (44 mL). Seasoning and other foods  Avoid adding excess amounts of salt to your foods. Try flavoring foods with herbs and spices instead of salt.  Avoid adding sugar to foods.  Try using oil-based dressings, sauces, and spreads instead of solid  fats. This information is based on general [place] nutrition guidelines. For more information, visit Build[employer].es. Exact amounts may vary based on your nutrition needs. Summary  A healthy eating plan may help you to maintain a healthy weight, reduce the risk of chronic diseases, and stay active throughout your life.  Plan your meals. Make sure you eat the right portions of a variety of nutrient-rich foods.  Try baking, boiling, grilling, or broiling instead of frying.  Choose healthy options in all settings, including home, work, school, restaurants, or stores. This information is not intended to replace advice given to you by your health care provider. Make sure you discuss any questions you have with your health care provider. Document Released: 01/24/2018 Document Revised: 01/24/2018 Document Reviewed: 01/24/2018 Elsevier Interactive Patient Education  2019 [employer].

## 2018-11-10 LAB — CBC WITH DIFFERENTIAL/PLATELET
BASOS ABS: 0.1 10*3/uL (ref 0.0–0.2)
Basos: 1 %
EOS (ABSOLUTE): 0.2 10*3/uL (ref 0.0–0.4)
Eos: 3 %
Hematocrit: 33.8 % — ABNORMAL LOW (ref 34.0–46.6)
Hemoglobin: 11.3 g/dL (ref 11.1–15.9)
Immature Grans (Abs): 0 10*3/uL (ref 0.0–0.1)
Immature Granulocytes: 0 %
Lymphocytes Absolute: 2.8 10*3/uL (ref 0.7–3.1)
Lymphs: 40 %
MCH: 27.8 pg (ref 26.6–33.0)
MCHC: 33.4 g/dL (ref 31.5–35.7)
MCV: 83 fL (ref 79–97)
Monocytes Absolute: 0.7 10*3/uL (ref 0.1–0.9)
Monocytes: 10 %
Neutrophils Absolute: 3.3 10*3/uL (ref 1.4–7.0)
Neutrophils: 46 %
Platelets: 351 10*3/uL (ref 150–450)
RBC: 4.07 x10E6/uL (ref 3.77–5.28)
RDW: 14.8 % (ref 11.7–15.4)
WBC: 7.1 10*3/uL (ref 3.4–10.8)

## 2018-11-10 LAB — LIPID PANEL
CHOL/HDL RATIO: 5.1 ratio — AB (ref 0.0–4.4)
Cholesterol, Total: 185 mg/dL (ref 100–199)
HDL: 36 mg/dL — ABNORMAL LOW (ref >39)
LDL Calculated: 134 mg/dL — ABNORMAL HIGH (ref 0–99)
Triglycerides: 77 mg/dL (ref 0–149)
VLDL Cholesterol Cal: 15 mg/dL (ref 5–40)

## 2018-11-10 LAB — CMP14 + ANION GAP
ALT: 72 IU/L — ABNORMAL HIGH (ref 0–32)
AST: 60 IU/L — ABNORMAL HIGH (ref 0–40)
Albumin/Globulin Ratio: 1.2 (ref 1.2–2.2)
Albumin: 4.3 g/dL (ref 3.6–4.8)
Alkaline Phosphatase: 56 IU/L (ref 39–117)
Anion Gap: 17 mmol/L (ref 10.0–18.0)
BUN/Creatinine Ratio: 21 (ref 12–28)
BUN: 18 mg/dL (ref 8–27)
Bilirubin Total: 0.7 mg/dL (ref 0.0–1.2)
CO2: 17 mmol/L — ABNORMAL LOW (ref 20–29)
Calcium: 10.8 mg/dL — ABNORMAL HIGH (ref 8.7–10.3)
Chloride: 106 mmol/L (ref 96–106)
Creatinine, Ser: 0.87 mg/dL (ref 0.57–1.00)
GFR calc Af Amer: 82 mL/min/1.73
GFR calc non Af Amer: 71 mL/min/1.73
Globulin, Total: 3.6 g/dL (ref 1.5–4.5)
Glucose: 77 mg/dL (ref 65–99)
Potassium: 4.8 mmol/L (ref 3.5–5.2)
Sodium: 140 mmol/L (ref 134–144)
Total Protein: 7.9 g/dL (ref 6.0–8.5)

## 2018-11-10 LAB — HEPATITIS C ANTIBODY

## 2019-01-02 ENCOUNTER — Other Ambulatory Visit: Payer: Self-pay | Admitting: Internal Medicine

## 2019-01-02 DIAGNOSIS — Z1231 Encounter for screening mammogram for malignant neoplasm of breast: Secondary | ICD-10-CM

## 2019-01-31 ENCOUNTER — Ambulatory Visit: Payer: BC Managed Care – PPO

## 2019-03-16 ENCOUNTER — Ambulatory Visit: Payer: BC Managed Care – PPO

## 2019-03-22 ENCOUNTER — Ambulatory Visit: Payer: BC Managed Care – PPO | Admitting: Nurse Practitioner

## 2019-03-22 ENCOUNTER — Encounter: Payer: Self-pay | Admitting: Nurse Practitioner

## 2019-03-22 ENCOUNTER — Other Ambulatory Visit: Payer: Self-pay

## 2019-03-22 VITALS — BP 150/90 | HR 72 | Temp 98.7°F | Ht 68.8 in | Wt 191.2 lb

## 2019-03-22 DIAGNOSIS — I10 Essential (primary) hypertension: Secondary | ICD-10-CM | POA: Diagnosis not present

## 2019-03-22 DIAGNOSIS — Z1211 Encounter for screening for malignant neoplasm of colon: Secondary | ICD-10-CM

## 2019-03-22 DIAGNOSIS — E782 Mixed hyperlipidemia: Secondary | ICD-10-CM

## 2019-03-22 DIAGNOSIS — F4321 Adjustment disorder with depressed mood: Secondary | ICD-10-CM | POA: Diagnosis not present

## 2019-03-22 DIAGNOSIS — Z634 Disappearance and death of family member: Secondary | ICD-10-CM

## 2019-03-22 DIAGNOSIS — K5180 Other ulcerative colitis without complications: Secondary | ICD-10-CM

## 2019-03-22 DIAGNOSIS — R21 Rash and other nonspecific skin eruption: Secondary | ICD-10-CM | POA: Diagnosis not present

## 2019-03-22 DIAGNOSIS — Z0000 Encounter for general adult medical examination without abnormal findings: Secondary | ICD-10-CM | POA: Diagnosis not present

## 2019-03-22 LAB — POCT URINALYSIS DIPSTICK
Bilirubin, UA: NEGATIVE
Glucose, UA: NEGATIVE
Ketones, UA: NEGATIVE
Leukocytes, UA: NEGATIVE
Nitrite, UA: NEGATIVE
Protein, UA: NEGATIVE
Spec Grav, UA: 1.02 (ref 1.010–1.025)
Urobilinogen, UA: 0.2 E.U./dL
pH, UA: 5.5 (ref 5.0–8.0)

## 2019-03-22 LAB — POCT UA - MICROALBUMIN
Albumin/Creatinine Ratio, Urine, POC: 30
Creatinine, POC: 200 mg/dL
Microalbumin Ur, POC: 10 mg/L

## 2019-03-22 MED ORDER — MAGNESIUM 250 MG PO TABS: 250.0000 mg | ORAL_TABLET | Freq: Every evening | ORAL | 3 refills | Status: DC

## 2019-03-22 MED ORDER — NYSTATIN 100000 UNIT/GM EX POWD: Freq: Four times a day (QID) | CUTANEOUS | 0 refills | Status: DC

## 2019-03-22 NOTE — Progress Notes (Addendum)
Subjective:     Patient ID: Virginia Robles , female    DOB: 09/28/1956 , 63 y.o.   MRN: [id number]   Chief Complaint  Patient presents with  . Annual Exam   The patient states she uses status post hysterectomy for birth control. Last LMP was No LMP recorded. Patient has had a hysterectomy..Negative for Dysmenorrhea and Negative for Menorrhagia Mammogram last done 01/14/2018 next scheduled for 04/22/2019. Negative for: breast discharge, breast lump(s), breast pain and breast self exam.  Pertinent negatives include abnormal bleeding (hematology), anxiety, decreased libido, depression, difficulty falling sleep, dyspareunia, history of infertility, nocturia, sexual dysfunction, sleep disturbances, urinary incontinence, urinary urgency, vaginal discharge and vaginal itching. Diet regular (only eats fish, [place] and chicken). The patient states her exercise level is  aerobics online.      The patient's tobacco use is:   Social History   Tobacco Use  Smoking Status Former Smoker  Smokeless Tobacco Never Used   She has been exposed to passive smoke. The patient's alcohol use is:  Social History   Substance and Sexual Activity  Alcohol Use Yes  . Alcohol/week: 3.0 standard drinks  . Types: 3 Glasses of wine per week   Additional information: Last pap (hysterectomy) .  HPI  Hypertension  This is a chronic problem. The current episode started more than 1 year ago. The problem is unchanged. The problem is controlled. Pertinent negatives include no anxiety or headaches. There are no associated agents to hypertension. There are no known risk factors for coronary artery disease. Past treatments include diuretics and ACE inhibitors. The current treatment provides moderate improvement. There are no compliance problems.      Past Medical History:  Diagnosis Date  . Hypertension   . Ulcerative colitis      Family History  Problem Relation Age of Onset  . Hypertension Mother   . Cancer  Mother   . Diabetes Father   . Stroke Sister   . Hypertension Sister   . Hyperlipidemia Sister   . Cancer Sister   . Irritable bowel syndrome Sister      Current Outpatient Medications:  .  cholecalciferol (VITAMIN D3) 25 MCG (1000 UT) tablet, Take 1,000 Units by mouth daily., Disp: , Rfl:  .  lisinopril (PRINIVIL,ZESTRIL) 40 MG tablet, Take 1 tablet (40 mg total) by mouth daily., Disp: 90 tablet, Rfl: 0 .  mesalamine (LIALDA) 1.2 g EC tablet, Take 1.2 g by mouth daily with breakfast. Patient taking as needed, Disp: , Rfl:  .  Multiple Vitamins-Minerals (MULTIVITAMIN ADULT PO), Take 1 tablet by mouth daily., Disp: , Rfl:    Allergies  Allergen Reactions  . Penicillins Hives  . Sulfur Hives     Review of Systems  Constitutional: Negative.   HENT: Negative.   Eyes: Negative.   Respiratory: Negative.   Cardiovascular: Negative.   Gastrointestinal: Negative.   Endocrine: Negative.   Genitourinary: Negative.   Musculoskeletal: Negative.   Skin: Negative.   Allergic/Immunologic: Negative.   Neurological: Negative.  Negative for dizziness and headaches.  Hematological: Negative.   Psychiatric/Behavioral: Negative.      Today's Vitals   03/22/19 0919  BP: (!) 150/90  Pulse: 72  Temp: 98.7 F (37.1 C)  TempSrc: Oral  Weight: 191 lb 3.2 oz (86.7 kg)  Height: 5' 8.8" (1.748 m)  PainSc: 0-No pain   Body mass index is 28.4 kg/m.   Objective:  Physical Exam Vitals signs reviewed.  Constitutional:      Appearance: Normal  appearance. She is well-developed.  HENT:     Head: Normocephalic and atraumatic.     Right Ear: Hearing, tympanic membrane, ear canal and external ear normal.     Left Ear: Hearing, tympanic membrane, ear canal and external ear normal.     Nose: Nose normal.     Mouth/Throat:     Mouth: Mucous membranes are moist.  Eyes:     General: Lids are normal.     Conjunctiva/sclera: Conjunctivae normal.     Pupils: Pupils are equal, round, and reactive to  light.     Funduscopic exam:    Right eye: No papilledema.        Left eye: No papilledema.  Neck:     Musculoskeletal: Full passive range of motion without pain, normal range of motion and neck supple.     Thyroid: No thyroid mass.     Vascular: No carotid bruit.  Cardiovascular:     Rate and Rhythm: Normal rate and regular rhythm.     Pulses: Normal pulses.     Heart sounds: Normal heart sounds. No murmur.  Pulmonary:     Effort: Pulmonary effort is normal.     Breath sounds: Normal breath sounds.  Chest:     Breasts:        Right: Normal. No mass or nipple discharge.        Left: Normal. No mass or nipple discharge.  Abdominal:     General: Abdomen is flat. Bowel sounds are normal.     Palpations: Abdomen is soft.  Musculoskeletal: Normal range of motion.        General: No swelling.     Right lower leg: No edema.     Left lower leg: No edema.  Skin:    General: Skin is warm and dry.     Capillary Refill: Capillary refill takes less than 2 seconds.     Findings: Rash (darkened moist area underneath bilateral breast) present.  Neurological:     General: No focal deficit present.     Mental Status: She is alert and oriented to person, place, and time.     Cranial Nerves: No cranial nerve deficit.     Sensory: No sensory deficit.  Psychiatric:        Mood and Affect: Mood normal.        Behavior: Behavior normal.        Thought Content: Thought content normal.        Judgment: Judgment normal.     Comments: tearful         Assessment And Plan:     1. Essential hypertension . B/P poorly controlled today, she is a little upset due to recent losses.  . CMP ordered to check renal function.  . The importance of regular exercise and dietary modification was stressed to the patient.  . Stressed importance of losing ten percent of her body weight to help with B/P control. - POCT Urinalysis Dipstick (81002) - POCT UA - Microalbumin - EKG 12-Lead - Magnesium 250 MG TABS;  Take 1 tablet (250 mg total) by mouth every evening.  Dispense: 30 tablet; Refill: 3 - CBC no Diff - CMP14 + Anion Gap  2. Encounter for screening colonoscopy According to USPTF Colorectal cancer Screening guidelines. Colonoscopy is recommended every 10 years, starting at age 48years. Will refer to GI for colon cancer screening. - Ambulatory referral to Gastroenterology  3. Grief  She has lost her mother at the end of March,  a good friend to Colon cancer in the last month and another friend's son committed suicide on Monday - Ambulatory referral to Psychology  4. Rash and nonspecific skin eruption  Darkened areas underneath breast bilaterally  Concerned could be yeast - nystatin (NYSTATIN) powder; Apply topically 4 (four) times daily.  Dispense: 15 g; Refill: 0  5. Mixed hyperlipidemia  Chronic, controlled  Continue with current medications - Lipid Profile  6. Other ulcerative colitis without complication (HCC)  Chronic  Reports recent exacerbation a few weeks ago however is under increased stress with recent losses  7. Health maintenance examination . Behavior modifications discussed and diet history reviewed.   . Pt will continue to exercise regularly and modify diet with low GI, plant based foods and decrease intake of processed foods.  . Recommend intake of daily multivitamin, Vitamin D, and calcium.  . Recommend colonoscopy for preventive screenings        Arnette Felts, FNP    THE PATIENT IS ENCOURAGED TO PRACTICE SOCIAL DISTANCING DUE TO THE COVID-19 PANDEMIC.

## 2019-03-23 ENCOUNTER — Telehealth: Payer: Self-pay

## 2019-03-23 LAB — CMP14 + ANION GAP
ALT: 75 IU/L — ABNORMAL HIGH (ref 0–32)
AST: 86 IU/L — ABNORMAL HIGH (ref 0–40)
Albumin/Globulin Ratio: 1.2 (ref 1.2–2.2)
Albumin: 4.4 g/dL (ref 3.8–4.8)
Alkaline Phosphatase: 73 IU/L (ref 39–117)
Anion Gap: 19 mmol/L — ABNORMAL HIGH (ref 10.0–18.0)
BUN/Creatinine Ratio: 16 (ref 12–28)
BUN: 15 mg/dL (ref 8–27)
Bilirubin Total: 0.7 mg/dL (ref 0.0–1.2)
CO2: 17 mmol/L — ABNORMAL LOW (ref 20–29)
Calcium: 11 mg/dL — ABNORMAL HIGH (ref 8.7–10.3)
Chloride: 103 mmol/L (ref 96–106)
Creatinine, Ser: 0.91 mg/dL (ref 0.57–1.00)
GFR calc Af Amer: 77 mL/min/{1.73_m2} (ref >59)
GFR calc non Af Amer: 67 mL/min/{1.73_m2} (ref >59)
Globulin, Total: 3.7 g/dL (ref 1.5–4.5)
Glucose: 96 mg/dL (ref 65–99)
Potassium: 4.7 mmol/L (ref 3.5–5.2)
Sodium: 139 mmol/L (ref 134–144)
Total Protein: 8.1 g/dL (ref 6.0–8.5)

## 2019-03-23 LAB — LIPID PANEL
Chol/HDL Ratio: 4.6 ratio — ABNORMAL HIGH (ref 0.0–4.4)
Cholesterol, Total: 185 mg/dL (ref 100–199)
HDL: 40 mg/dL (ref >39)
LDL Calculated: 125 mg/dL — ABNORMAL HIGH (ref 0–99)
Triglycerides: 101 mg/dL (ref 0–149)
VLDL Cholesterol Cal: 20 mg/dL (ref 5–40)

## 2019-03-23 LAB — CBC
Hematocrit: 36.9 % (ref 34.0–46.6)
Hemoglobin: 12.3 g/dL (ref 11.1–15.9)
MCH: 28 pg (ref 26.6–33.0)
MCHC: 33.3 g/dL (ref 31.5–35.7)
MCV: 84 fL (ref 79–97)
Platelets: 375 10*3/uL (ref 150–450)
RBC: 4.39 x10E6/uL (ref 3.77–5.28)
RDW: 14.4 % (ref 11.7–15.4)
WBC: 8 10*3/uL (ref 3.4–10.8)

## 2019-03-23 NOTE — Telephone Encounter (Signed)
1st attempt to give lab results  

## 2019-03-23 NOTE — Telephone Encounter (Signed)
-----   Message from Arnette Felts, FNP sent at 03/23/2019  9:09 AM EDT ----- Blood levels are normal. Your liver functions are up again, I believe we have talked about this before.  Have you had an ultrasound of your abdomen in the last year? Total cholesterol level is normal.  LDL is down to 125 from 134 limit your intake of fried and fatty foods.

## 2019-03-24 ENCOUNTER — Encounter: Payer: Self-pay | Admitting: Nurse Practitioner

## 2019-03-27 NOTE — Progress Notes (Signed)
Okay; thanks.

## 2019-04-22 ENCOUNTER — Other Ambulatory Visit: Payer: Self-pay

## 2019-04-22 ENCOUNTER — Ambulatory Visit
Admission: RE | Admit: 2019-04-22 | Discharge: 2019-04-22 | Disposition: A | Discharge status: Home / Self Care | Payer: BC Managed Care – PPO | Source: Ambulatory Visit | Attending: Internal Medicine | Admitting: Internal Medicine

## 2019-04-22 DIAGNOSIS — Z1231 Encounter for screening mammogram for malignant neoplasm of breast: Secondary | ICD-10-CM

## 2019-05-16 ENCOUNTER — Other Ambulatory Visit: Payer: Self-pay | Admitting: *Deleted

## 2019-05-16 DIAGNOSIS — Z20822 Contact with and (suspected) exposure to covid-19: Secondary | ICD-10-CM

## 2019-05-21 LAB — NOVEL CORONAVIRUS, NAA: SARS-CoV-2, NAA: NOT DETECTED

## 2019-05-23 ENCOUNTER — Other Ambulatory Visit: Payer: Self-pay | Admitting: Nurse Practitioner

## 2019-05-23 ENCOUNTER — Telehealth: Payer: Self-pay | Admitting: Nurse Practitioner

## 2019-05-23 DIAGNOSIS — R21 Rash and other nonspecific skin eruption: Secondary | ICD-10-CM

## 2019-05-23 NOTE — Telephone Encounter (Signed)
Pt was given covid-19(not detected) result/ Pt verbalized understanding  °

## 2019-06-07 ENCOUNTER — Telehealth: Payer: Self-pay

## 2019-06-07 ENCOUNTER — Encounter: Payer: Self-pay | Admitting: Nurse Practitioner

## 2019-06-07 ENCOUNTER — Ambulatory Visit: Payer: BC Managed Care – PPO | Admitting: Nurse Practitioner

## 2019-06-07 ENCOUNTER — Other Ambulatory Visit: Payer: Self-pay

## 2019-06-07 VITALS — BP 132/80 | HR 60 | Temp 98.0°F | Ht 67.6 in | Wt 190.4 lb

## 2019-06-07 DIAGNOSIS — M79672 Pain in left foot: Secondary | ICD-10-CM

## 2019-06-07 DIAGNOSIS — M21612 Bunion of left foot: Secondary | ICD-10-CM | POA: Diagnosis not present

## 2019-06-07 MED ORDER — PREDNISONE 10 MG (21) PO TBPK: ORAL_TABLET | ORAL | 0 refills | Status: DC

## 2019-06-07 MED ORDER — DICLOFENAC SODIUM 1 % TD GEL: 2.0000 g | Freq: Four times a day (QID) | TRANSDERMAL | 1 refills | Status: AC

## 2019-06-07 NOTE — Progress Notes (Signed)
Subjective:     Patient ID: Virginia Robles , female    DOB: 01/09/1956 , 63 y.o.   MRN: [id number]   Chief Complaint  Patient presents with  . Foot Pain    patient stated she has some left foot pain for the last week. she stated it feels numb under the ball of her toe as if she was getting a bunion    HPI  Foot Pain This is a new problem. The current episode started in the past 7 days. Pertinent negatives include no abdominal pain, chest pain or headaches. The symptoms are aggravated by walking (able to put on a sneaker but not a shoe). She has tried acetaminophen for the symptoms.     Past Medical History:  Diagnosis Date  . Hypertension   . Ulcerative colitis      Family History  Problem Relation Age of Onset  . Hypertension Mother   . Cancer Mother   . Diabetes Father   . Stroke Sister   . Hypertension Sister   . Hyperlipidemia Sister   . Cancer Sister   . Irritable bowel syndrome Sister   . Breast cancer Niece [id number]     Current Outpatient Medications:  .  cholecalciferol (VITAMIN D3) 25 MCG (1000 UT) tablet, Take 1,000 Units by mouth daily., Disp: , Rfl:  .  lisinopril (PRINIVIL,ZESTRIL) 40 MG tablet, Take 1 tablet (40 mg total) by mouth daily., Disp: 90 tablet, Rfl: 0 .  mesalamine (LIALDA) 1.2 g EC tablet, Take 1.2 g by mouth daily with breakfast. Patient taking as needed, Disp: , Rfl:  .  Multiple Vitamins-Minerals (MULTIVITAMIN ADULT PO), Take 1 tablet by mouth daily., Disp: , Rfl:  .  NYSTATIN powder, APPLY TOPICALLY FOUR TIMES DAILY, Disp: 15 g, Rfl: 0 .  Magnesium 250 MG TABS, Take 1 tablet (250 mg total) by mouth every evening. (Patient not taking: Reported on 06/07/2019), Disp: 30 tablet, Rfl: 3   Allergies  Allergen Reactions  . Penicillins Hives  . Sulfur Hives     Review of Systems  Constitutional: Negative.   Respiratory: Negative.   Cardiovascular: Negative.  Negative for chest pain, palpitations and leg swelling.  Gastrointestinal: Negative  for abdominal pain.  Neurological: Negative for dizziness and headaches.     Today's Vitals   06/07/19 1217  BP: 132/80  Pulse: 60  Temp: 98 F (36.7 C)  TempSrc: Oral  Weight: 190 lb 6.4 oz (86.4 kg)  Height: 5' 7.6" (1.717 m)  PainSc: 2   PainLoc: Foot   Body mass index is 29.29 kg/m.   Objective:  Physical Exam Constitutional:      Appearance: Normal appearance.  Cardiovascular:     Rate and Rhythm: Normal rate and regular rhythm.     Pulses: Normal pulses.     Heart sounds: Normal heart sounds.  Pulmonary:     Effort: Pulmonary effort is normal. No respiratory distress.     Breath sounds: Normal breath sounds.  Musculoskeletal: Normal range of motion.        General: Tenderness (left great toe pain with erythema) present. No deformity (bunion present to left great toe).  Skin:    General: Skin is warm and dry.     Findings: Erythema (left great toe) present.  Neurological:     General: No focal deficit present.     Mental Status: She is alert and oriented to person, place, and time.  Psychiatric:        Mood and Affect:  Mood normal.        Thought Content: Thought content normal.        Judgment: Judgment normal.         Assessment And Plan:     1. Left foot pain  Erythema to left great toe  Gout vs inflammed bunion  Sent Rx for prednisone to pharmacy advised to avoid possible food triggers such as shrimp and beer(deneis alcohol intake) - predniSONE (STERAPRED UNI-PAK 21 TAB) 10 MG (21) TBPK tablet; Take as directed.  Dispense: 21 tablet; Refill: 0  2. Bunion of great toe of left foot  Erythema to great toe - predniSONE (STERAPRED UNI-PAK 21 TAB) 10 MG (21) TBPK tablet; Take as directed.  Dispense: 21 tablet; Refill: 0   Minette Brine, FNP    THE PATIENT IS ENCOURAGED TO PRACTICE SOCIAL DISTANCING DUE TO THE COVID-19 PANDEMIC.

## 2019-07-14 ENCOUNTER — Other Ambulatory Visit: Payer: Self-pay | Admitting: Nurse Practitioner

## 2019-07-24 ENCOUNTER — Other Ambulatory Visit: Payer: Self-pay

## 2019-07-24 ENCOUNTER — Ambulatory Visit (INDEPENDENT_AMBULATORY_CARE_PROVIDER_SITE_OTHER): Payer: BC Managed Care – PPO | Admitting: Nurse Practitioner

## 2019-07-24 ENCOUNTER — Encounter: Payer: Self-pay | Admitting: Nurse Practitioner

## 2019-07-24 VITALS — BP 120/82 | HR 62 | Temp 98.2°F | Ht 67.8 in | Wt 180.0 lb

## 2019-07-24 DIAGNOSIS — M79672 Pain in left foot: Secondary | ICD-10-CM | POA: Diagnosis not present

## 2019-07-24 DIAGNOSIS — I10 Essential (primary) hypertension: Secondary | ICD-10-CM

## 2019-07-24 DIAGNOSIS — E782 Mixed hyperlipidemia: Secondary | ICD-10-CM

## 2019-07-24 NOTE — Progress Notes (Addendum)
Subjective:     Patient ID: Virginia Robles , female    DOB: [birth year] , 63 y.o.   MRN: [id number]   Chief Complaint  Patient presents with  . Hypertension    HPI  Left foot swelling and mild tenderness. Improved after her last visit in August, has began swelling slightly. She has not taken any additional medications since taking the prednisone.    Hypertension This is a chronic problem. The current episode started more than 1 year ago. The problem is unchanged. The problem is controlled. Pertinent negatives include no anxiety, chest pain, headaches, malaise/fatigue or palpitations. There are no associated agents to hypertension. There are no known risk factors for coronary artery disease. Past treatments include ACE inhibitors. The current treatment provides moderate improvement. There are no compliance problems.  There is no history of angina, kidney disease or heart failure. There is no history of chronic renal disease.  Hyperlipidemia This is a chronic problem. The current episode started more than 1 year ago. The problem is controlled. Recent lipid tests were reviewed and are high. She has no history of chronic renal disease. There are no known factors aggravating her hyperlipidemia. Pertinent negatives include no chest pain. There are no compliance problems.  There are no known risk factors for coronary artery disease.     Past Medical History:  Diagnosis Date  . Hypertension   . Ulcerative colitis      Family History  Problem Relation Age of Onset  . Hypertension Mother   . Cancer Mother   . Diabetes Father   . Stroke Sister   . Hypertension Sister   . Hyperlipidemia Sister   . Cancer Sister   . Irritable bowel syndrome Sister   . Breast cancer Niece 28     Current Outpatient Medications:  .  cholecalciferol (VITAMIN D3) 25 MCG (1000 UT) tablet, Take 1,000 Units by mouth daily., Disp: , Rfl:  .  diclofenac sodium (VOLTAREN) 1 % GEL, Apply 2 g topically 4  (four) times daily., Disp: 50 g, Rfl: 1 .  lisinopril (ZESTRIL) 40 MG tablet, TAKE 1 TABLET(40 MG) BY MOUTH DAILY, Disp: 90 tablet, Rfl: 0 .  mesalamine (LIALDA) 1.2 g EC tablet, Take 1.2 g by mouth daily with breakfast. Patient taking as needed, Disp: , Rfl:  .  Multiple Vitamins-Minerals (MULTIVITAMIN ADULT PO), Take 1 tablet by mouth daily., Disp: , Rfl:  .  NYSTATIN powder, APPLY TOPICALLY FOUR TIMES DAILY, Disp: 15 g, Rfl: 0 .  Magnesium 250 MG TABS, Take 1 tablet (250 mg total) by mouth every evening. (Patient not taking: Reported on 06/07/2019), Disp: 30 tablet, Rfl: 3   Allergies  Allergen Reactions  . Penicillins Hives  . Sulfur Hives     Review of Systems  Constitutional: Negative for fatigue and malaise/fatigue.  Eyes: Negative.   Respiratory: Negative for cough.   Cardiovascular: Negative.  Negative for chest pain, palpitations and leg swelling.  Endocrine: Negative for polydipsia, polyphagia and polyuria.  Musculoskeletal: Negative.   Neurological: Negative for dizziness and headaches.     Today's Vitals   07/24/19 0852  BP: 120/82  Pulse: 62  Temp: 98.2 F (36.8 C)  TempSrc: Oral  Weight: 180 lb (81.6 kg)  Height: 5' 7.8" (1.722 m)  PainSc: 2   PainLoc: Foot   Body mass index is 27.53 kg/m.   Objective:  Physical Exam Vitals signs reviewed.  Constitutional:      General: She is not in acute distress.  Appearance: Normal appearance.  Neck:     Musculoskeletal: Normal range of motion and neck supple. No muscular tenderness.     Comments: Bilateral "puffiness" to supraclavicular spaces soft Cardiovascular:     Rate and Rhythm: Normal rate and regular rhythm.     Pulses: Normal pulses.     Heart sounds: Normal heart sounds. No murmur.  Pulmonary:     Effort: Pulmonary effort is normal.     Breath sounds: Normal breath sounds. No wheezing.  Musculoskeletal:     Comments: Left lateral dorsal surface of foot with trace edema, nontender to touch   Lymphadenopathy:     Cervical: No cervical adenopathy.  Skin:    General: Skin is warm and dry.     Capillary Refill: Capillary refill takes less than 2 seconds.  Neurological:     General: No focal deficit present.     Mental Status: She is alert and oriented to person, place, and time.  Psychiatric:        Mood and Affect: Mood normal.        Behavior: Behavior normal.        Thought Content: Thought content normal.        Judgment: Judgment normal.         Assessment And Plan:     1. Essential hypertension . B/P is controlled.  . CMP ordered to check renal function.  . The importance of regular exercise and dietary modification was stressed to the patient.  . Stressed importance of losing ten percent of her body weight to help with B/P control.  . The weight loss would help with decreasing cardiac and cancer risk as well.  - CBC with Diff  2. Mixed hyperlipidemia  Chronic, controlled  No current medications - CMP14 + Anion Gap - CBC with Diff  3. Left foot pain  Mild swelling to dorsal surface of foot laterally  Will check uric acid   If not better and normal uric acid will consider sending for an xray to check for structural damage - Uric acid  She is scheduled for a colonoscopy next week  Arnette Felts, FNP

## 2019-07-25 LAB — CMP14 + ANION GAP
ALT: 35 IU/L — ABNORMAL HIGH (ref 0–32)
AST: 33 IU/L (ref 0–40)
Albumin/Globulin Ratio: 1.3 (ref 1.2–2.2)
Albumin: 4.4 g/dL (ref 3.8–4.8)
Alkaline Phosphatase: 60 IU/L (ref 39–117)
Anion Gap: 12 mmol/L (ref 10.0–18.0)
BUN/Creatinine Ratio: 10 — ABNORMAL LOW (ref 12–28)
BUN: 9 mg/dL (ref 8–27)
Bilirubin Total: 0.6 mg/dL (ref 0.0–1.2)
CO2: 20 mmol/L (ref 20–29)
Calcium: 10.6 mg/dL — ABNORMAL HIGH (ref 8.7–10.3)
Chloride: 109 mmol/L — ABNORMAL HIGH (ref 96–106)
Creatinine, Ser: 0.9 mg/dL (ref 0.57–1.00)
GFR calc Af Amer: 78 mL/min/{1.73_m2} (ref >59)
GFR calc non Af Amer: 68 mL/min/{1.73_m2} (ref >59)
Globulin, Total: 3.3 g/dL (ref 1.5–4.5)
Glucose: 92 mg/dL (ref 65–99)
Potassium: 4.7 mmol/L (ref 3.5–5.2)
Sodium: 141 mmol/L (ref 134–144)
Total Protein: 7.7 g/dL (ref 6.0–8.5)

## 2019-07-25 LAB — LIPID PANEL
Chol/HDL Ratio: 4.7 ratio — ABNORMAL HIGH (ref 0.0–4.4)
Cholesterol, Total: 189 mg/dL (ref 100–199)
HDL: 40 mg/dL (ref >39)
LDL Chol Calc (NIH): 134 mg/dL — ABNORMAL HIGH (ref 0–99)
Triglycerides: 83 mg/dL (ref 0–149)
VLDL Cholesterol Cal: 15 mg/dL (ref 5–40)

## 2019-07-25 LAB — URIC ACID: Uric Acid: 8.1 mg/dL — ABNORMAL HIGH (ref 2.5–7.1)

## 2019-07-26 ENCOUNTER — Encounter: Payer: Self-pay | Admitting: Nurse Practitioner

## 2019-07-26 LAB — HM COLONOSCOPY

## 2019-08-01 ENCOUNTER — Encounter: Payer: Self-pay | Admitting: Nurse Practitioner

## 2019-08-14 ENCOUNTER — Telehealth: Payer: Self-pay

## 2019-08-14 NOTE — Telephone Encounter (Signed)
Pt stated she is feeling better, she said she will cut back on fish she doesn't eat red meat Minette Brine, FNP  Candiss Norse T, CMA        Your LDL is slightly up to 134 limit intake of fried and fatty foods. Kidney functions are Normal. Your liver functions are continuing to improve. Your uric acid levels are up, you were likely having a gout flare, how are you feeling now. Avoid red meat and fish. Also alcohol can cause this level to increase and cause pain

## 2019-09-01 ENCOUNTER — Other Ambulatory Visit: Payer: Self-pay | Admitting: Nurse Practitioner

## 2019-09-01 DIAGNOSIS — R21 Rash and other nonspecific skin eruption: Secondary | ICD-10-CM

## 2019-09-20 ENCOUNTER — Other Ambulatory Visit: Payer: Self-pay | Admitting: Nurse Practitioner

## 2019-10-23 ENCOUNTER — Encounter: Payer: Self-pay | Admitting: Nurse Practitioner

## 2019-10-23 ENCOUNTER — Other Ambulatory Visit: Payer: Self-pay | Admitting: Nurse Practitioner

## 2019-10-23 DIAGNOSIS — R21 Rash and other nonspecific skin eruption: Secondary | ICD-10-CM

## 2019-10-24 ENCOUNTER — Other Ambulatory Visit: Payer: Self-pay

## 2019-10-24 DIAGNOSIS — R21 Rash and other nonspecific skin eruption: Secondary | ICD-10-CM

## 2019-10-24 MED ORDER — NYSTATIN 100000 UNIT/GM EX POWD: CUTANEOUS | 0 refills | Status: DC

## 2019-10-24 MED ORDER — LISINOPRIL 40 MG PO TABS: ORAL_TABLET | ORAL | 0 refills | Status: DC

## 2019-11-28 ENCOUNTER — Encounter: Payer: Self-pay | Admitting: Nurse Practitioner

## 2019-11-28 ENCOUNTER — Ambulatory Visit: Payer: BC Managed Care – PPO | Admitting: Nurse Practitioner

## 2019-11-28 VITALS — BP 122/84 | HR 64 | Temp 98.3°F | Ht 68.2 in | Wt 190.0 lb

## 2019-11-28 DIAGNOSIS — E782 Mixed hyperlipidemia: Secondary | ICD-10-CM | POA: Diagnosis not present

## 2019-11-28 DIAGNOSIS — I10 Essential (primary) hypertension: Secondary | ICD-10-CM

## 2019-11-28 DIAGNOSIS — E790 Hyperuricemia without signs of inflammatory arthritis and tophaceous disease: Secondary | ICD-10-CM | POA: Diagnosis not present

## 2019-11-28 NOTE — Progress Notes (Signed)
Subjective:     Patient ID: Virginia Robles , female    DOB: 07-23-1956 , 64 y.o.   MRN: [id number]   Chief Complaint  Patient presents with  . Hypertension    HPI   She is trying to schedule her vaccine.    Hypertension This is a chronic problem. The current episode started more than 1 year ago. The problem is unchanged. The problem is controlled. Pertinent negatives include no anxiety, chest pain, headaches, malaise/fatigue or palpitations. There are no associated agents to hypertension. There are no known risk factors for coronary artery disease. Past treatments include ACE inhibitors. The current treatment provides moderate improvement. There are no compliance problems.  There is no history of angina, kidney disease or heart failure. There is no history of chronic renal disease.  Hyperlipidemia This is a chronic problem. The current episode started more than 1 year ago. The problem is controlled. Recent lipid tests were reviewed and are high. She has no history of chronic renal disease. There are no known factors aggravating her hyperlipidemia. Pertinent negatives include no chest pain. There are no compliance problems.  There are no known risk factors for coronary artery disease.     Past Medical History:  Diagnosis Date  . Hypertension   . Ulcerative colitis      Family History  Problem Relation Age of Onset  . Hypertension Mother   . Cancer Mother   . Diabetes Father   . Stroke Sister   . Hypertension Sister   . Hyperlipidemia Sister   . Cancer Sister   . Irritable bowel syndrome Sister   . Breast cancer Niece 20     Current Outpatient Medications:  .  cholecalciferol (VITAMIN D3) 25 MCG (1000 UT) tablet, Take 1,000 Units by mouth daily., Disp: , Rfl:  .  diclofenac sodium (VOLTAREN) 1 % GEL, Apply 2 g topically 4 (four) times daily., Disp: 50 g, Rfl: 1 .  lisinopril (ZESTRIL) 40 MG tablet, TAKE 1 TABLET(40 MG) BY MOUTH DAILY, Disp: 90 tablet, Rfl: 0 .   mesalamine (LIALDA) 1.2 g EC tablet, Take 1.2 g by mouth daily with breakfast. Patient taking as needed, Disp: , Rfl:  .  Multiple Vitamins-Minerals (MULTIVITAMIN ADULT PO), Take 1 tablet by mouth daily., Disp: , Rfl:  .  nystatin (NYSTATIN) powder, APPLY TOPICALLY TO THE AFFECTED AREA FOUR TIMES DAILY, Disp: 15 g, Rfl: 0 .  Magnesium 250 MG TABS, Take 1 tablet (250 mg total) by mouth every evening. (Patient not taking: Reported on 06/07/2019), Disp: 30 tablet, Rfl: 3   Allergies  Allergen Reactions  . Penicillins Hives  . Sulfur Hives     Review of Systems  Constitutional: Negative for fatigue and malaise/fatigue.  Eyes: Negative.   Respiratory: Negative for cough.   Cardiovascular: Negative.  Negative for chest pain, palpitations and leg swelling.  Endocrine: Negative for polydipsia, polyphagia and polyuria.  Musculoskeletal: Negative.   Neurological: Negative for dizziness and headaches.  Psychiatric/Behavioral: Negative.  Negative for agitation. The patient is not nervous/anxious.      Today's Vitals   11/28/19 0851  BP: 122/84  Pulse: 64  Temp: 98.3 F (36.8 C)  TempSrc: Oral  Weight: 190 lb (86.2 kg)  Height: 5' 8.2" (1.732 m)  PainSc: 0-No pain   Body mass index is 28.72 kg/m.   Objective:  Physical Exam Vitals reviewed.  Constitutional:      General: She is not in acute distress.    Appearance: Normal appearance.  Neck:  Comments: Bilateral "puffiness" to supraclavicular spaces soft Cardiovascular:     Rate and Rhythm: Normal rate and regular rhythm.     Pulses: Normal pulses.     Heart sounds: Normal heart sounds. No murmur.  Pulmonary:     Effort: Pulmonary effort is normal. No respiratory distress.     Breath sounds: Normal breath sounds. No wheezing.  Musculoskeletal:     Cervical back: Normal range of motion and neck supple. No muscular tenderness.  Lymphadenopathy:     Cervical: No cervical adenopathy.  Skin:    General: Skin is warm and dry.      Capillary Refill: Capillary refill takes less than 2 seconds.  Neurological:     General: No focal deficit present.     Mental Status: She is alert and oriented to person, place, and time.     Cranial Nerves: No cranial nerve deficit.  Psychiatric:        Mood and Affect: Mood normal.        Behavior: Behavior normal.        Thought Content: Thought content normal.        Judgment: Judgment normal.         Assessment And Plan:     1. Essential hypertension . B/P is controlled.  . CMP ordered to check renal function.  . The importance of regular exercise and dietary modification was stressed to the patient.   - CBC with Diff  2. Mixed hyperlipidemia  Chronic, controlled  No current medications - CMP14 + Anion Gap - CBC with Diff  3. Elevated blood uric acid level  No recent episodes of joint pain - Uric acid   Minette Brine, FNP

## 2019-11-29 LAB — LIPID PANEL
Chol/HDL Ratio: 4.9 ratio — ABNORMAL HIGH (ref 0.0–4.4)
Cholesterol, Total: 194 mg/dL (ref 100–199)
HDL: 40 mg/dL (ref >39)
LDL Chol Calc (NIH): 133 mg/dL — ABNORMAL HIGH (ref 0–99)
Triglycerides: 115 mg/dL (ref 0–149)
VLDL Cholesterol Cal: 21 mg/dL (ref 5–40)

## 2019-11-29 LAB — BMP8+EGFR
BUN/Creatinine Ratio: 14 (ref 12–28)
BUN: 12 mg/dL (ref 8–27)
CO2: 21 mmol/L (ref 20–29)
Calcium: 10.8 mg/dL — ABNORMAL HIGH (ref 8.7–10.3)
Chloride: 106 mmol/L (ref 96–106)
Creatinine, Ser: 0.85 mg/dL (ref 0.57–1.00)
GFR calc Af Amer: 84 mL/min/{1.73_m2} (ref >59)
GFR calc non Af Amer: 73 mL/min/{1.73_m2} (ref >59)
Glucose: 94 mg/dL (ref 65–99)
Potassium: 4.9 mmol/L (ref 3.5–5.2)
Sodium: 139 mmol/L (ref 134–144)

## 2019-11-29 LAB — URIC ACID: Uric Acid: 7.6 mg/dL — ABNORMAL HIGH (ref 3.0–7.2)

## 2020-01-31 ENCOUNTER — Other Ambulatory Visit: Payer: Self-pay | Admitting: Nurse Practitioner

## 2020-03-13 ENCOUNTER — Other Ambulatory Visit: Payer: Self-pay

## 2020-03-13 ENCOUNTER — Encounter: Payer: Self-pay | Admitting: Nurse Practitioner

## 2020-03-13 ENCOUNTER — Ambulatory Visit: Payer: Medicare PPO | Admitting: Nurse Practitioner

## 2020-03-13 VITALS — BP 138/88 | HR 67 | Temp 98.3°F | Ht 67.0 in | Wt 191.8 lb

## 2020-03-13 DIAGNOSIS — R200 Anesthesia of skin: Secondary | ICD-10-CM | POA: Diagnosis not present

## 2020-03-13 DIAGNOSIS — R202 Paresthesia of skin: Secondary | ICD-10-CM

## 2020-03-13 DIAGNOSIS — E2839 Other primary ovarian failure: Secondary | ICD-10-CM | POA: Diagnosis not present

## 2020-03-13 DIAGNOSIS — I10 Essential (primary) hypertension: Secondary | ICD-10-CM

## 2020-03-13 MED ORDER — MAGNESIUM 250 MG PO TABS: 250.0000 mg | ORAL_TABLET | Freq: Every evening | ORAL | 1 refills | Status: DC

## 2020-03-13 NOTE — Progress Notes (Signed)
This visit occurred during the SARS-CoV-2 public health emergency.  Safety protocols were in place, including screening questions prior to the visit, additional usage of staff PPE, and extensive cleaning of exam room while observing appropriate contact time as indicated for disinfecting solutions.  Subjective:     Patient ID: Virginia Robles , female    DOB: Oct 13, 1956 , 64 y.o.   MRN: [id number]   Chief Complaint  Patient presents with  . Numbness    Hands/feet    HPI  She is here today for numbness in her toes and bilateral hands. She is having cramps in her legs at night.  She is not having falls.  She continues to walk regularly Monday - Thursday.  She is drinking approximately 64 oz of water a day. She is taking a MVI and vitamin D3 daily.  She is right handed and numbness to hands are more at night.      Past Medical History:  Diagnosis Date  . Hypertension   . Ulcerative colitis      Family History  Problem Relation Age of Onset  . Hypertension Mother   . Cancer Mother   . Diabetes Father   . Stroke Sister   . Hypertension Sister   . Hyperlipidemia Sister   . Cancer Sister   . Irritable bowel syndrome Sister   . Breast cancer Niece 22     Current Outpatient Medications:  .  cholecalciferol (VITAMIN D3) 25 MCG (1000 UT) tablet, Take 1,000 Units by mouth daily., Disp: , Rfl:  .  lisinopril (ZESTRIL) 40 MG tablet, TAKE 1 TABLET(40 MG) BY MOUTH DAILY, Disp: 90 tablet, Rfl: 0 .  Magnesium 250 MG TABS, Take 1 tablet (250 mg total) by mouth every evening., Disp: 30 tablet, Rfl: 3 .  mesalamine (LIALDA) 1.2 g EC tablet, Take 1.2 g by mouth daily with breakfast. Patient taking as needed, Disp: , Rfl:  .  Multiple Vitamins-Minerals (MULTIVITAMIN ADULT PO), Take 1 tablet by mouth daily., Disp: , Rfl:  .  nystatin (NYSTATIN) powder, APPLY TOPICALLY TO THE AFFECTED AREA FOUR TIMES DAILY, Disp: 15 g, Rfl: 0 .  diclofenac sodium (VOLTAREN) 1 % GEL, Apply 2 g topically 4  (four) times daily. (Patient not taking: Reported on 03/13/2020), Disp: 50 g, Rfl: 1   Allergies  Allergen Reactions  . Penicillins Hives  . Sulfur Hives     Review of Systems   Today's Vitals   03/13/20 1435  BP: 138/88  Pulse: 67  Temp: 98.3 F (36.8 C)  Weight: 191 lb 12.8 oz (87 kg)  Height: 5\' 7"  (1.702 m)  PainSc: 0-No pain   Body mass index is 30.04 kg/m.   Objective:  Physical Exam Vitals reviewed.  Constitutional:      Appearance: Normal appearance.  Cardiovascular:     Rate and Rhythm: Normal rate and regular rhythm.     Pulses: Normal pulses.     Heart sounds: Normal heart sounds. No murmur.  Pulmonary:     Effort: Pulmonary effort is normal. No respiratory distress.     Breath sounds: Normal breath sounds.  Musculoskeletal:     Comments: Negative tinel's and phalen's bilaterally  Skin:    General: Skin is warm and dry.     Capillary Refill: Capillary refill takes less than 2 seconds.  Neurological:     General: No focal deficit present.     Mental Status: She is alert and oriented to person, place, and time.  Cranial Nerves: No cranial nerve deficit.  Psychiatric:        Mood and Affect: Mood normal.        Behavior: Behavior normal.        Thought Content: Thought content normal.        Judgment: Judgment normal.         Assessment And Plan:     1. Decreased estrogen level  Will order bone density   Discussed importance of vitamin d/calcium - DG Bone Density; Future  2. Numbness and tingling  Will check for metabolic causes  She is to get a wrist splint to see if improves  Negative tinel's and phalen's - Vitamin B12 - TSH - Hemoglobin A1c  3. Essential hypertension  Chronic, fair control  Magnesium may help with blood pressure and cramping to legs/tingling skin - Magnesium 250 MG TABS; Take 1 tablet (250 mg total) by mouth every evening.  Dispense: 90 tablet; Refill: 1       Arnette Felts, FNP    THE PATIENT IS  ENCOURAGED TO PRACTICE SOCIAL DISTANCING DUE TO THE COVID-19 PANDEMIC.

## 2020-03-14 LAB — HEMOGLOBIN A1C
Est. average glucose Bld gHb Est-mCnc: 103 mg/dL
Hgb A1c MFr Bld: 5.2 % (ref 4.8–5.6)

## 2020-03-14 LAB — VITAMIN B12: Vitamin B-12: 508 pg/mL (ref 232–1245)

## 2020-03-14 LAB — TSH: TSH: 1.92 u[IU]/mL (ref 0.450–4.500)

## 2020-03-18 IMAGING — MG DIGITAL SCREENING BILATERAL MAMMOGRAM WITH CAD
4 series · 4 of 4 positions shown · non-contrast
Comparison: Previous exam(s).

CLINICAL DATA: Screening.

EXAM:
DIGITAL SCREENING BILATERAL MAMMOGRAM WITH CAD

[L MLO]
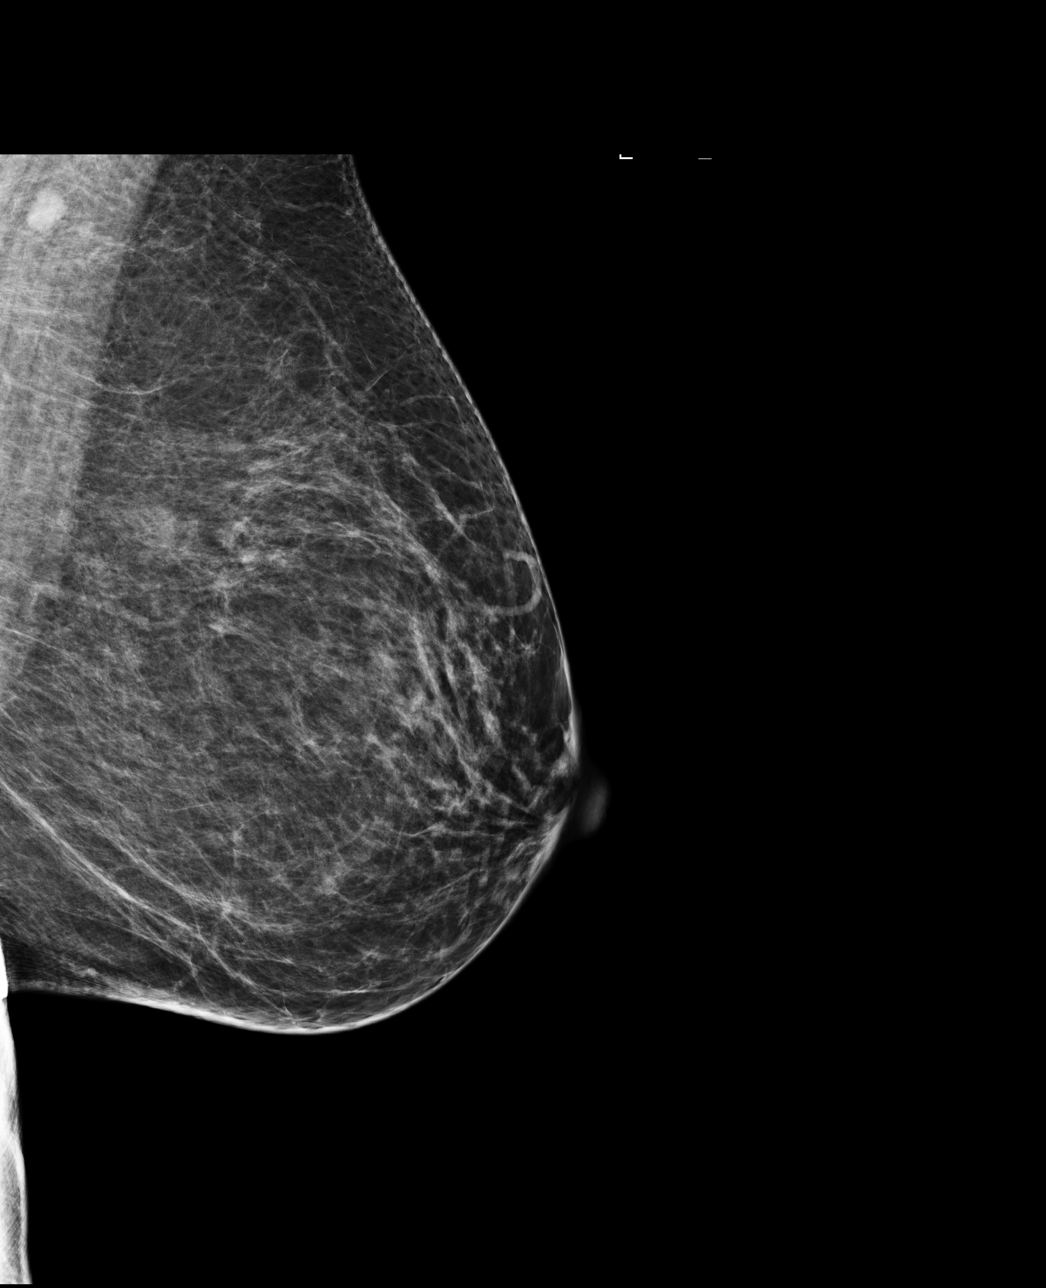

[L CC]
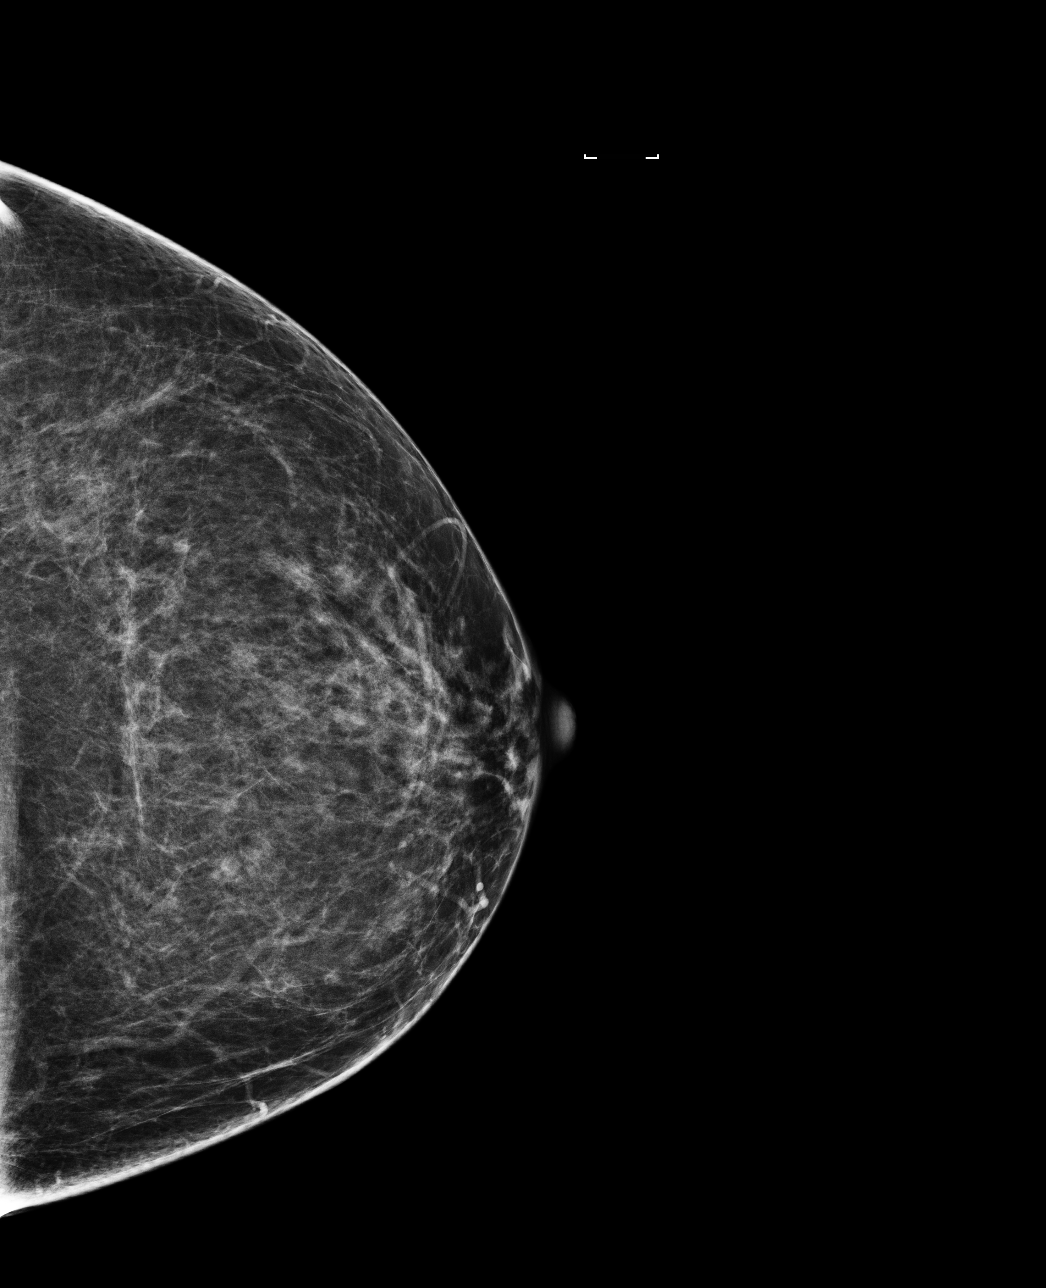

[R MLO]
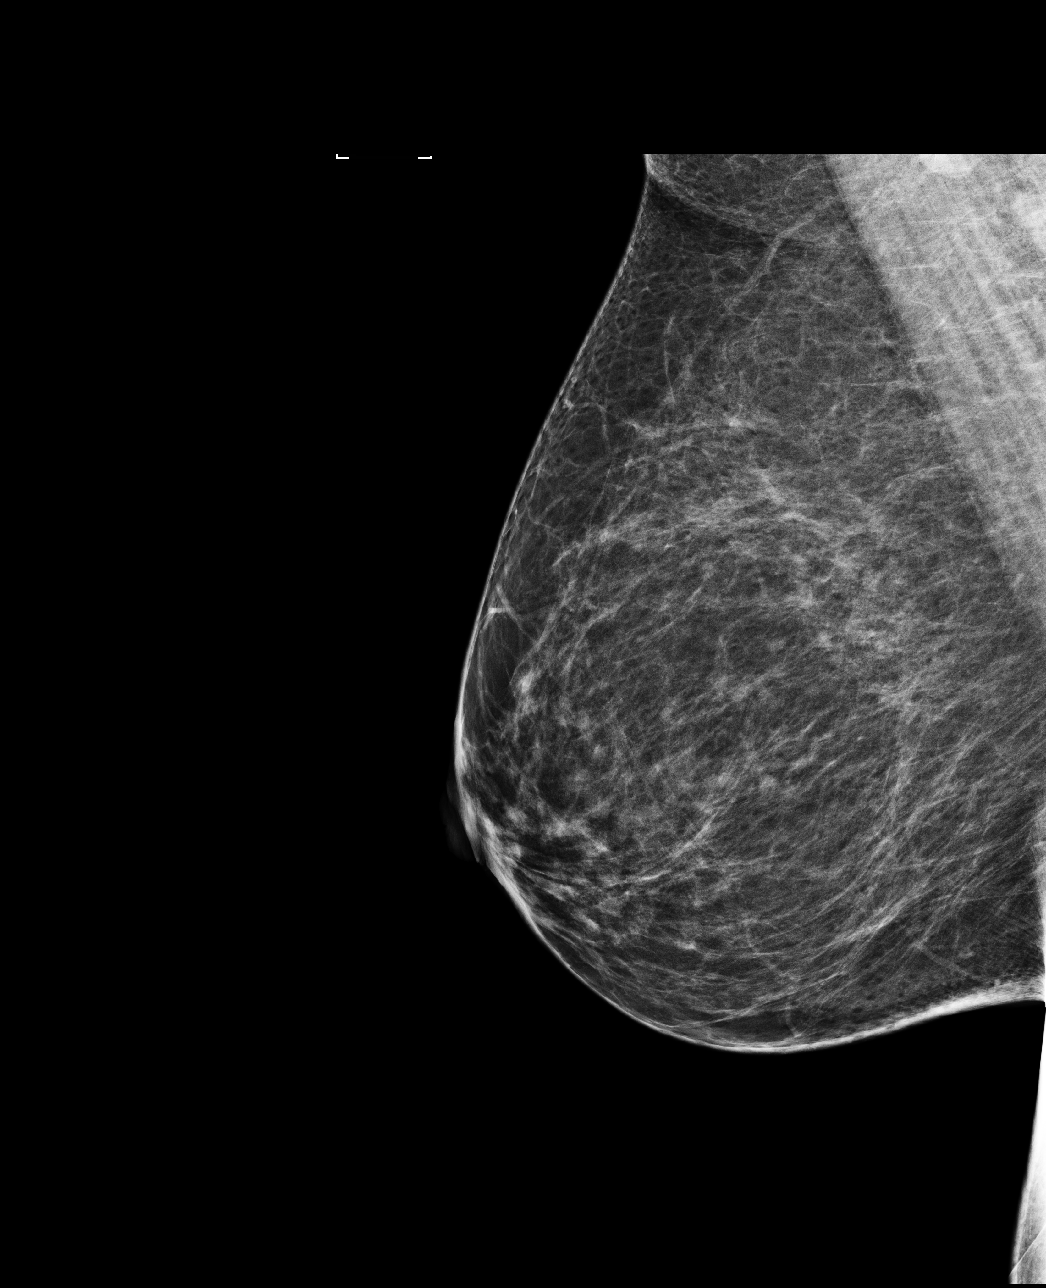

[R CC]
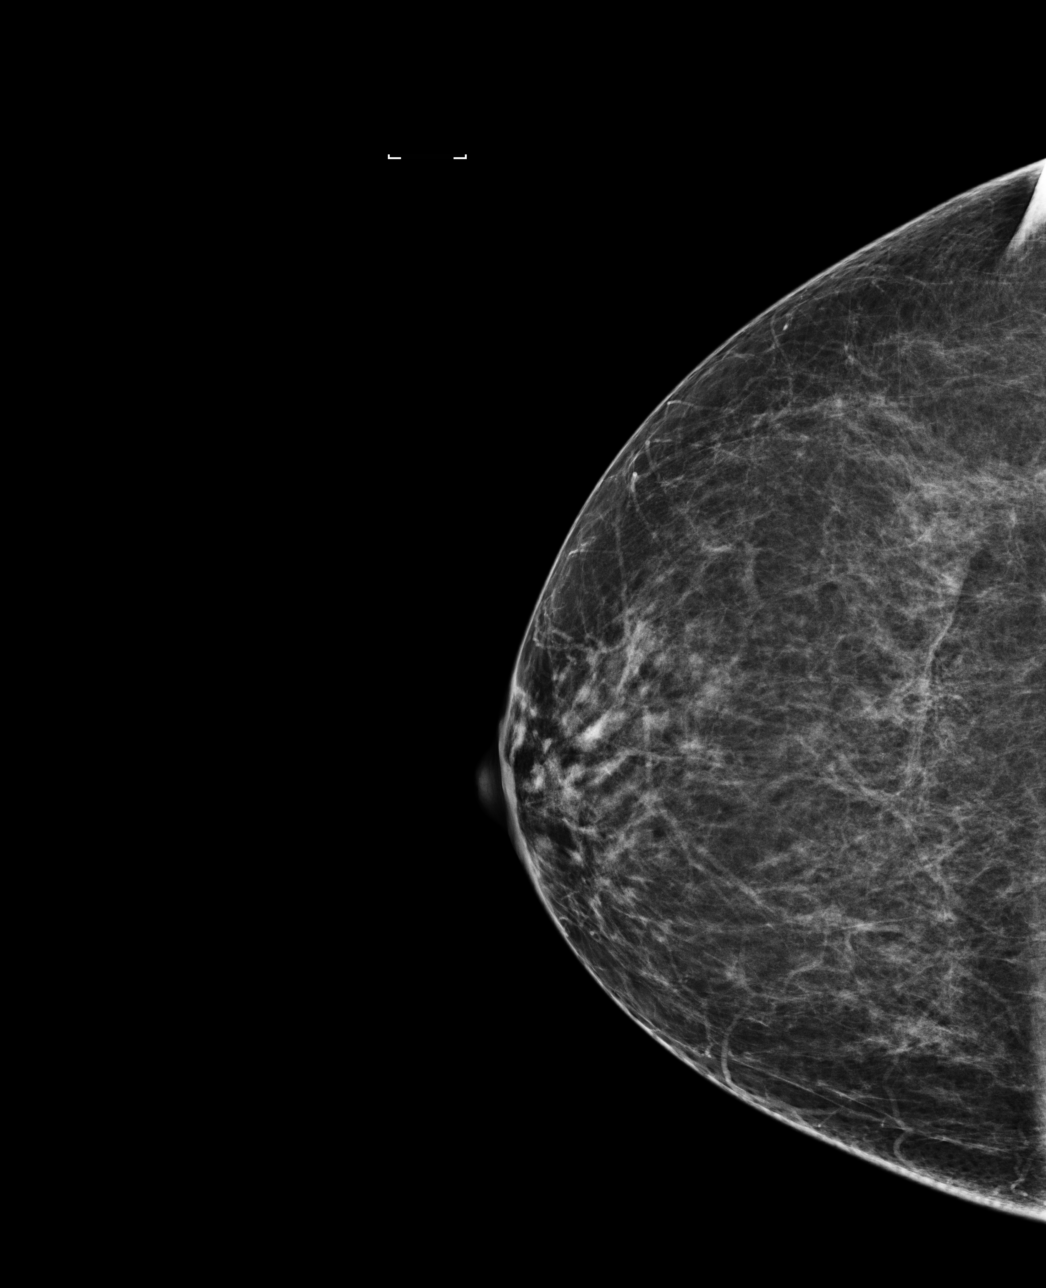

[4 of 4 positions shown; findings below may reference images not displayed]

ACR Breast Density Category b: There are scattered areas of
fibroglandular density.
FINDINGS: There are no findings suspicious for malignancy. Images were
processed with CAD.
IMPRESSION: No mammographic evidence of malignancy. A result letter of this
screening mammogram will be mailed directly to the patient.

RECOMMENDATION:
Screening mammogram in one year. (Code:AS-G-LCT)

BI-RADS CATEGORY  1: Negative.

## 2020-03-21 ENCOUNTER — Ambulatory Visit
Admission: RE | Admit: 2020-03-21 | Discharge: 2020-03-21 | Disposition: A | Discharge status: Home / Self Care | Payer: Medicare PPO | Source: Ambulatory Visit | Attending: Nurse Practitioner | Admitting: Nurse Practitioner

## 2020-03-21 ENCOUNTER — Other Ambulatory Visit: Payer: Self-pay

## 2020-03-21 DIAGNOSIS — E2839 Other primary ovarian failure: Secondary | ICD-10-CM

## 2020-03-26 ENCOUNTER — Encounter: Payer: BC Managed Care – PPO | Admitting: Nurse Practitioner

## 2020-04-17 ENCOUNTER — Other Ambulatory Visit: Payer: Self-pay | Admitting: Nurse Practitioner

## 2020-04-25 ENCOUNTER — Ambulatory Visit: Payer: BC Managed Care – PPO | Admitting: Nurse Practitioner

## 2020-05-31 LAB — HM MAMMOGRAPHY

## 2020-06-03 ENCOUNTER — Telehealth: Payer: Self-pay

## 2020-06-03 NOTE — Telephone Encounter (Signed)
lvm for pt to return call to make an appt

## 2020-06-13 ENCOUNTER — Encounter: Payer: Self-pay | Admitting: Nurse Practitioner

## 2020-06-19 ENCOUNTER — Ambulatory Visit: Payer: Medicare PPO | Admitting: Nurse Practitioner

## 2020-06-19 ENCOUNTER — Encounter: Payer: Self-pay | Admitting: Nurse Practitioner

## 2020-06-19 ENCOUNTER — Other Ambulatory Visit: Payer: Self-pay

## 2020-06-19 VITALS — BP 124/80 | HR 72 | Temp 98.3°F | Ht 67.0 in | Wt 185.0 lb

## 2020-06-19 DIAGNOSIS — M1A0710 Idiopathic chronic gout, right ankle and foot, without tophus (tophi): Secondary | ICD-10-CM

## 2020-06-19 DIAGNOSIS — I10 Essential (primary) hypertension: Secondary | ICD-10-CM | POA: Diagnosis not present

## 2020-06-19 DIAGNOSIS — E7800 Pure hypercholesterolemia, unspecified: Secondary | ICD-10-CM

## 2020-06-19 MED ORDER — TRIAMCINOLONE ACETONIDE 40 MG/ML IJ SUSP
60.0000 mg | Freq: Once | INTRAMUSCULAR | Status: AC
  Administered 2020-06-19: 60 mg via INTRAMUSCULAR

## 2020-06-19 MED ORDER — COLCHICINE 0.6 MG PO CAPS: 1.0000 | ORAL_CAPSULE | Freq: Every day | ORAL | 2 refills | Status: DC | PRN

## 2020-06-19 NOTE — Patient Instructions (Signed)
 Gout  Gout is painful swelling of your joints. Gout is a type of arthritis. It is caused by having too much uric acid in your body. Uric acid is a chemical that is made when your body breaks down substances called purines. If your body has too much uric acid, sharp crystals can form and build up in your joints. This causes pain and swelling. Gout attacks can happen quickly and be very painful (acute gout). Over time, the attacks can affect more joints and happen more often (chronic gout). What are the causes?  Too much uric acid in your blood. This can happen because: ? Your kidneys do not remove enough uric acid from your blood. ? Your body makes too much uric acid. ? You eat too many foods that are high in purines. These foods include organ meats, some seafood, and beer.  Trauma or stress. What increases the risk?  Having a family history of gout.  Being female and middle-aged.  Being female and having gone through menopause.  Being very overweight (obese).  Drinking alcohol, especially beer.  Not having enough water in the body (being dehydrated).  Losing weight too quickly.  Having an organ transplant.  Having lead poisoning.  Taking certain medicines.  Having kidney disease.  Having a skin condition called psoriasis. What are the signs or symptoms? An attack of acute gout usually happens in just one joint. The most common place is the big toe. Attacks often start at night. Other joints that may be affected include joints of the feet, ankle, knee, fingers, wrist, or elbow. Symptoms of an attack may include:  Very bad pain.  Warmth.  Swelling.  Stiffness.  Shiny, red, or purple skin.  Tenderness. The affected joint may be very painful to touch.  Chills and fever. Chronic gout may cause symptoms more often. More joints may be involved. You may also have white or yellow lumps (tophi) on your hands or feet or in other areas near your joints. How is this  treated?  Treatment for this condition has two phases: treating an acute attack and preventing future attacks.  Acute gout treatment may include: ? NSAIDs. ? Steroids. These are taken by mouth or injected into a joint. ? Colchicine. This medicine relieves pain and swelling. It can be given by mouth or through an IV tube.  Preventive treatment may include: ? Taking small doses of NSAIDs or colchicine daily. ? Using a medicine that reduces uric acid levels in your blood. ? Making changes to your diet. You may need to see a food expert (dietitian) about what to eat and drink to prevent gout. Follow these instructions at home: During a gout attack   If told, put ice on the painful area: ? Put ice in a plastic bag. ? Place a towel between your skin and the bag. ? Leave the ice on for 20 minutes, 2-3 times a day.  Raise (elevate) the painful joint above the level of your heart as often as you can.  Rest the joint as much as possible. If the joint is in your leg, you may be given crutches.  Follow instructions from your doctor about what you cannot eat or drink. Avoiding future gout attacks  Eat a low-purine diet. Avoid foods and drinks such as: ? Liver. ? Kidney. ? Anchovies. ? Asparagus. ? Herring. ? Mushrooms. ? Mussels. ? Beer.  Stay at a healthy weight. If you want to lose weight, talk with your doctor. Do not lose   weight too fast.  Start or continue an exercise plan as told by your doctor. Eating and drinking  Drink enough fluids to keep your pee (urine) pale yellow.  If you drink alcohol: ? Limit how much you use to:  0-1 drink a day for women.  0-2 drinks a day for men. ? Be aware of how much alcohol is in your drink. In the [place] one drink equals one 12 oz bottle of beer (355 mL), one 5 oz glass of wine (148 mL), or one 1 oz glass of hard liquor (44 mL). General instructions  Take over-the-counter and prescription medicines only as told by your doctor.  Do  not drive or use heavy machinery while taking prescription pain medicine.  Return to your normal activities as told by your doctor. Ask your doctor what activities are safe for you.  Keep all follow-up visits as told by your doctor. This is important. Contact a doctor if:  You have another gout attack.  You still have symptoms of a gout attack after 10 days of treatment.  You have problems (side effects) because of your medicines.  You have chills or a fever.  You have burning pain when you pee (urinate).  You have pain in your lower back or belly. Get help right away if:  You have very bad pain.  Your pain cannot be controlled.  You cannot pee. Summary  Gout is painful swelling of the joints.  The most common site of pain is the big toe, but it can affect other joints.  Medicines and avoiding some foods can help to prevent and treat gout attacks. This information is not intended to replace advice given to you by your health care provider. Make sure you discuss any questions you have with your health care provider. Document Revised: 05/04/2018 Document Reviewed: 05/04/2018 Elsevier Patient Education  2020 [employer]  

## 2020-06-19 NOTE — Progress Notes (Signed)
This visit occurred during the SARS-CoV-2 public health emergency.  Safety protocols were in place, including screening questions prior to the visit, additional usage of staff PPE, and extensive cleaning of exam room while observing appropriate contact time as indicated for disinfecting solutions.  Subjective:     Patient ID: Virginia Robles , female    DOB: August 29, 1956 , 64 y.o.   MRN: [id number]   Chief Complaint  Patient presents with  . Hypertension  . Foot Pain    patient stated her foot has been hurting her    HPI  Left great toe pain she has to keep the covers from touching her foot  Hypertension This is a chronic problem. The current episode started more than 1 year ago. The problem is unchanged. The problem is controlled. Pertinent negatives include no anxiety, chest pain or palpitations. Risk factors for coronary artery disease include sedentary lifestyle. Past treatments include ACE inhibitors. There are no compliance problems.  There is no history of angina or kidney disease. There is no history of chronic renal disease.     Past Medical History:  Diagnosis Date  . Hypertension   . Ulcerative colitis      Family History  Problem Relation Age of Onset  . Hypertension Mother   . Cancer Mother   . Diabetes Father   . Stroke Sister   . Hypertension Sister   . Hyperlipidemia Sister   . Cancer Sister   . Irritable bowel syndrome Sister   . Breast cancer Niece 4     Current Outpatient Medications:  .  cholecalciferol (VITAMIN D3) 25 MCG (1000 UT) tablet, Take 1,000 Units by mouth daily., Disp: , Rfl:  .  diclofenac sodium (VOLTAREN) 1 % GEL, Apply 2 g topically 4 (four) times daily., Disp: 50 g, Rfl: 1 .  lisinopril (ZESTRIL) 40 MG tablet, TAKE 1 TABLET(40 MG) BY MOUTH DAILY, Disp: 90 tablet, Rfl: 0 .  mesalamine (LIALDA) 1.2 g EC tablet, Take 1.2 g by mouth daily with breakfast. Patient taking as needed, Disp: , Rfl:  .  Multiple Vitamins-Minerals  (MULTIVITAMIN ADULT PO), Take 1 tablet by mouth daily., Disp: , Rfl:  .  nystatin (NYSTATIN) powder, APPLY TOPICALLY TO THE AFFECTED AREA FOUR TIMES DAILY, Disp: 15 g, Rfl: 0 .  Colchicine (MITIGARE) 0.6 MG CAPS, Take 1 tablet by mouth daily as needed., Disp: 30 capsule, Rfl: 2 .  Magnesium 250 MG TABS, Take 1 tablet (250 mg total) by mouth every evening. (Patient not taking: Reported on 06/19/2020), Disp: 90 tablet, Rfl: 1   Allergies  Allergen Reactions  . Penicillins Hives  . Sulfur Hives     Review of Systems  Constitutional: Negative.   Respiratory: Negative.  Negative for cough.   Cardiovascular: Negative for chest pain, palpitations and leg swelling.  Musculoskeletal: Positive for arthralgias (left great toe - using tylenol and voltaren gel).  Neurological: Negative.   Psychiatric/Behavioral: Negative.      Today's Vitals   06/19/20 1535  BP: 124/80  Pulse: 72  Temp: 98.3 F (36.8 C)  TempSrc: Oral  Weight: 185 lb (83.9 kg)  Height: _0  (1.702 m)  PainSc: 10-Worst pain ever  PainLoc: Foot   Body mass index is 28.98 kg/m.   Objective:  Physical Exam Constitutional:      General: She is not in acute distress.    Appearance: Normal appearance.  Cardiovascular:     Pulses: Normal pulses.     Heart sounds: Normal heart sounds. No  murmur heard.   Pulmonary:     Effort: Pulmonary effort is normal. No respiratory distress.     Breath sounds: Normal breath sounds.  Musculoskeletal:        General: Tenderness (right medial great toe with swelling noted to medial foot ) present.  Skin:    General: Skin is warm.     Findings: No erythema.  Neurological:     General: No focal deficit present.     Mental Status: She is alert and oriented to person, place, and time.     Cranial Nerves: No cranial nerve deficit.  Psychiatric:        Mood and Affect: Mood normal.        Behavior: Behavior normal.        Thought Content: Thought content normal.        Judgment:  Judgment normal.         Assessment And Plan:     1. Essential hypertension Comments: chronic, doing well continue with current medications - BMP8+eGFR  2. Chronic idiopathic gout involving toe of right foot without tophus Comments: she is having more exacerbations, will send Rx for colchicine as needed - Colchicine (MITIGARE) 0.6 MG CAPS; Take 1 tablet by mouth daily as needed.  Dispense: 30 capsule; Refill: 2 - triamcinolone acetonide (KENALOG-40) injection 60 mg  3. Elevated LDL cholesterol level Comments: chronic, stable, diet controlled - Lipid panel     Patient was given opportunity to ask questions. Patient verbalized understanding of the plan and was able to repeat key elements of the plan. All questions were answered to their satisfaction.  Minette Brine, FNP   I, Minette Brine, FNP, have reviewed all documentation for this visit. The documentation on 06/21/20 for the exam, diagnosis, procedures, and orders are all accurate and complete.  THE PATIENT IS ENCOURAGED TO PRACTICE SOCIAL DISTANCING DUE TO THE COVID-19 PANDEMIC.

## 2020-06-20 LAB — BMP8+EGFR
BUN/Creatinine Ratio: 17 (ref 12–28)
BUN: 13 mg/dL (ref 8–27)
CO2: 18 mmol/L — ABNORMAL LOW (ref 20–29)
Calcium: 11 mg/dL — ABNORMAL HIGH (ref 8.7–10.3)
Chloride: 107 mmol/L — ABNORMAL HIGH (ref 96–106)
Creatinine, Ser: 0.78 mg/dL (ref 0.57–1.00)
GFR calc Af Amer: 92 mL/min/{1.73_m2} (ref >59)
GFR calc non Af Amer: 80 mL/min/{1.73_m2} (ref >59)
Glucose: 84 mg/dL (ref 65–99)
Potassium: 4.3 mmol/L (ref 3.5–5.2)
Sodium: 143 mmol/L (ref 134–144)

## 2020-06-20 LAB — LIPID PANEL
Chol/HDL Ratio: 6.1 ratio — ABNORMAL HIGH (ref 0.0–4.4)
Cholesterol, Total: 196 mg/dL (ref 100–199)
HDL: 32 mg/dL — ABNORMAL LOW (ref >39)
LDL Chol Calc (NIH): 113 mg/dL — ABNORMAL HIGH (ref 0–99)
Triglycerides: 294 mg/dL — ABNORMAL HIGH (ref 0–149)
VLDL Cholesterol Cal: 51 mg/dL — ABNORMAL HIGH (ref 5–40)

## 2020-07-29 ENCOUNTER — Other Ambulatory Visit: Payer: Self-pay | Admitting: Nurse Practitioner

## 2020-07-30 ENCOUNTER — Encounter: Payer: Self-pay | Admitting: Nurse Practitioner

## 2020-08-01 ENCOUNTER — Ambulatory Visit: Payer: Medicare PPO | Attending: Internal Medicine

## 2020-08-01 ENCOUNTER — Telehealth (INDEPENDENT_AMBULATORY_CARE_PROVIDER_SITE_OTHER): Payer: Medicare PPO | Admitting: Nurse Practitioner

## 2020-08-01 ENCOUNTER — Encounter: Payer: Self-pay | Admitting: Nurse Practitioner

## 2020-08-01 ENCOUNTER — Telehealth: Payer: Self-pay

## 2020-08-01 ENCOUNTER — Other Ambulatory Visit: Payer: Self-pay

## 2020-08-01 DIAGNOSIS — F4321 Adjustment disorder with depressed mood: Secondary | ICD-10-CM | POA: Diagnosis not present

## 2020-08-01 DIAGNOSIS — F419 Anxiety disorder, unspecified: Secondary | ICD-10-CM | POA: Diagnosis not present

## 2020-08-01 DIAGNOSIS — Z23 Encounter for immunization: Secondary | ICD-10-CM

## 2020-08-01 NOTE — Progress Notes (Signed)
Virtual Visit via telephone   This visit type was conducted due to national recommendations for restrictions regarding the COVID-19 Pandemic (e.g. social distancing) in an effort to limit this patient's exposure and mitigate transmission in our community.  Due to her co-morbid illnesses, this patient is at least at moderate risk for complications without adequate follow up.  This format is felt to be most appropriate for this patient at this time.  All issues noted in this document were discussed and addressed.  A limited physical exam was performed with this format.    This visit type was conducted due to national recommendations for restrictions regarding the COVID-19 Pandemic (e.g. social distancing) in an effort to limit this patient's exposure and mitigate transmission in our community.  Patients identity confirmed using two different identifiers.  This format is felt to be most appropriate for this patient at this time.  All issues noted in this document were discussed and addressed.  No physical exam was performed (except for noted visual exam findings with Video Visits).    Date:  10/21/2020   ID:  Virginia Robles, DOB October 18, 1956, MRN [id number]  Patient Location:  Home - spoke with Gordy Savers  Provider location:   Office    Chief Complaint:  Referral for grief counseling  History of Present Illness:    Virginia Robles is a 64 y.o. female who presents via video conferencing for a telehealth visit today.    The patient does not have symptoms concerning for COVID-19 infection (fever, chills, cough, or new shortness of breath).   Virtual visit due to feeling like she has a lot of anxiety. She does not want to go out. She is getting about 2 hours. She is feeling depressed. She has lost her mother and her great nephew to suicide within the last year.  She is having diarrhea with her magnesium.    Anxiety Presents for follow-up visit. Symptoms include excessive worry,  insomnia, malaise and nervous/anxious behavior. Patient reports no chest pain, palpitations, shortness of breath or suicidal ideas. Symptoms occur most days. Duration: when she is going out her stomach will get tied up in knots. The quality of sleep is poor. Nighttime awakenings: several.   Side effects of treatment include GI discomfort.     Past Medical History:  Diagnosis Date  . Hypertension   . Ulcerative colitis    No past surgical history on file.   Current Meds  Medication Sig  . cholecalciferol (VITAMIN D3) 25 MCG (1000 UT) tablet Take 1,000 Units by mouth daily.  . diclofenac sodium (VOLTAREN) 1 % GEL Apply 2 g topically 4 (four) times daily.  Marland Kitchen lisinopril (ZESTRIL) 40 MG tablet TAKE 1 TABLET(40 MG) BY MOUTH DAILY  . Magnesium 250 MG TABS Take 1 tablet (250 mg total) by mouth every evening.  . mesalamine (LIALDA) 1.2 g EC tablet Take 1.2 g by mouth daily with breakfast. Patient taking as needed  . Multiple Vitamins-Minerals (MULTIVITAMIN ADULT PO) Take 1 tablet by mouth daily.     Allergies:   Penicillins and Sulfur   Social History   Tobacco Use  . Smoking status: Former Games developer  . Smokeless tobacco: Never Used  Substance Use Topics  . Alcohol use: Yes    Alcohol/week: 3.0 standard drinks    Types: 3 Glasses of wine per week  . Drug use: Never     Family Hx: The patient's family history includes Breast cancer (age of onset: 11) in her niece; Cancer  in her mother and sister; Diabetes in her father; Hyperlipidemia in her sister; Hypertension in her mother and sister; Irritable bowel syndrome in her sister; Stroke in her sister.  ROS:   Please see the history of present illness.    Review of Systems  Constitutional: Negative.   Respiratory: Negative for shortness of breath.   Cardiovascular: Negative for chest pain and palpitations.  Psychiatric/Behavioral: Negative for suicidal ideas. The patient is nervous/anxious and has insomnia.     All other systems  reviewed and are negative.   Labs/Other Tests and Data Reviewed:    Recent Labs: 03/13/2020: TSH 1.920 06/19/2020: BUN 13; Creatinine, Ser 0.78; Potassium 4.3; Sodium 143   Recent Lipid Panel Lab Results  Component Value Date/Time   CHOL 196 06/19/2020 04:28 PM   TRIG 294 (H) 06/19/2020 04:28 PM   HDL 32 (L) 06/19/2020 04:28 PM   CHOLHDL 6.1 (H) 06/19/2020 04:28 PM   LDLCALC 113 (H) 06/19/2020 04:28 PM    Wt Readings from Last 3 Encounters:  06/19/20 185 lb (83.9 kg)  03/13/20 191 lb 12.8 oz (87 kg)  11/28/19 190 lb (86.2 kg)     Exam:    Vital Signs:  There were no vitals taken for this visit.    Physical Exam Constitutional:      General: She is not in acute distress. Pulmonary:     Effort: Pulmonary effort is normal. No respiratory distress.  Neurological:     General: No focal deficit present.     Mental Status: She is oriented to person, place, and time.  Psychiatric:        Behavior: Behavior normal.        Thought Content: Thought content normal.        Judgment: Judgment normal.     Comments: sad     ASSESSMENT & PLAN:    1. Anxiety  Will refer for counseling - Ambulatory referral to Psychology  2. Grief  Dealing with the loss of her mother and nephew and she feels she needs to talk with someone - Ambulatory referral to Psychology    COVID-19 Education: The signs and symptoms of COVID-19 were discussed with the patient and how to seek care for testing (follow up with PCP or arrange E-visit).  The importance of social distancing was discussed today.  Patient Risk:   After full review of this patients clinical status, I feel that they are at least moderate risk at this time.  Time:   Today, I have spent 10 minutes/ seconds with the patient with telehealth technology discussing above diagnoses.     Medication Adjustments/Labs and Tests Ordered: Current medicines are reviewed at length with the patient today.  Concerns regarding medicines are  outlined above.   Tests Ordered: Orders Placed This Encounter  Procedures  . Ambulatory referral to Psychology    Medication Changes: No orders of the defined types were placed in this encounter.   Disposition:  Follow up prn  Signed, Arnette Felts, FNP

## 2020-08-01 NOTE — Progress Notes (Signed)
   Covid-19 Vaccination Clinic  Name:  Virginia Robles    MRN: [id number] DOB: [birth year]  08/01/2020  Virginia Robles was observed post Covid-19 immunization for 15 minutes without incident. She was provided with Vaccine Information Sheet and instruction to access the V-Safe system.   Virginia Robles was instructed to call 911 with any severe reactions post vaccine: Marland Kitchen Difficulty breathing  . Swelling of face and throat  . A fast heartbeat  . A bad rash all over body  . Dizziness and weakness

## 2020-08-01 NOTE — Telephone Encounter (Signed)
Patient consented to virtual appointment. YL,RMA 

## 2020-08-29 ENCOUNTER — Ambulatory Visit: Payer: Medicare PPO | Admitting: Psychologist

## 2020-09-11 ENCOUNTER — Ambulatory Visit: Payer: Medicare PPO | Admitting: Psychologist

## 2020-09-11 ENCOUNTER — Ambulatory Visit (INDEPENDENT_AMBULATORY_CARE_PROVIDER_SITE_OTHER): Payer: Medicare PPO | Admitting: Psychologist

## 2020-09-11 DIAGNOSIS — F321 Major depressive disorder, single episode, moderate: Secondary | ICD-10-CM

## 2020-09-11 DIAGNOSIS — Z634 Disappearance and death of family member: Secondary | ICD-10-CM | POA: Diagnosis not present

## 2020-09-25 ENCOUNTER — Encounter: Payer: Medicare PPO | Admitting: Nurse Practitioner

## 2020-09-26 DIAGNOSIS — R110 Nausea: Secondary | ICD-10-CM | POA: Diagnosis not present

## 2020-09-26 DIAGNOSIS — R1033 Periumbilical pain: Secondary | ICD-10-CM | POA: Diagnosis not present

## 2020-09-26 DIAGNOSIS — K5190 Ulcerative colitis, unspecified, without complications: Secondary | ICD-10-CM | POA: Diagnosis not present

## 2020-09-26 DIAGNOSIS — R140 Abdominal distension (gaseous): Secondary | ICD-10-CM | POA: Diagnosis not present

## 2020-09-30 ENCOUNTER — Ambulatory Visit (INDEPENDENT_AMBULATORY_CARE_PROVIDER_SITE_OTHER): Payer: Medicare PPO | Admitting: Psychologist

## 2020-09-30 DIAGNOSIS — F321 Major depressive disorder, single episode, moderate: Secondary | ICD-10-CM

## 2020-09-30 DIAGNOSIS — Z634 Disappearance and death of family member: Secondary | ICD-10-CM

## 2020-10-21 ENCOUNTER — Encounter: Payer: Self-pay | Admitting: Nurse Practitioner

## 2020-11-04 ENCOUNTER — Ambulatory Visit (INDEPENDENT_AMBULATORY_CARE_PROVIDER_SITE_OTHER): Payer: Medicare PPO | Admitting: Psychologist

## 2020-11-04 DIAGNOSIS — F321 Major depressive disorder, single episode, moderate: Secondary | ICD-10-CM

## 2020-11-04 DIAGNOSIS — Z634 Disappearance and death of family member: Secondary | ICD-10-CM

## 2020-11-13 ENCOUNTER — Ambulatory Visit (INDEPENDENT_AMBULATORY_CARE_PROVIDER_SITE_OTHER): Payer: Medicare PPO | Admitting: Psychologist

## 2020-11-13 ENCOUNTER — Other Ambulatory Visit: Payer: Self-pay | Admitting: Nurse Practitioner

## 2020-11-13 DIAGNOSIS — F321 Major depressive disorder, single episode, moderate: Secondary | ICD-10-CM | POA: Diagnosis not present

## 2020-11-13 DIAGNOSIS — Z634 Disappearance and death of family member: Secondary | ICD-10-CM | POA: Diagnosis not present

## 2020-11-18 ENCOUNTER — Encounter: Payer: Self-pay | Admitting: Nurse Practitioner

## 2020-11-18 ENCOUNTER — Ambulatory Visit (INDEPENDENT_AMBULATORY_CARE_PROVIDER_SITE_OTHER): Payer: Medicare PPO | Admitting: Nurse Practitioner

## 2020-11-18 ENCOUNTER — Other Ambulatory Visit: Payer: Self-pay

## 2020-11-18 VITALS — BP 138/90 | HR 68 | Temp 98.6°F | Ht 68.4 in | Wt 184.6 lb

## 2020-11-18 DIAGNOSIS — Z23 Encounter for immunization: Secondary | ICD-10-CM | POA: Diagnosis not present

## 2020-11-18 DIAGNOSIS — K219 Gastro-esophageal reflux disease without esophagitis: Secondary | ICD-10-CM

## 2020-11-18 DIAGNOSIS — E782 Mixed hyperlipidemia: Secondary | ICD-10-CM | POA: Diagnosis not present

## 2020-11-18 DIAGNOSIS — Z0000 Encounter for general adult medical examination without abnormal findings: Secondary | ICD-10-CM

## 2020-11-18 DIAGNOSIS — R110 Nausea: Secondary | ICD-10-CM

## 2020-11-18 DIAGNOSIS — M1A0710 Idiopathic chronic gout, right ankle and foot, without tophus (tophi): Secondary | ICD-10-CM | POA: Diagnosis not present

## 2020-11-18 DIAGNOSIS — I10 Essential (primary) hypertension: Secondary | ICD-10-CM | POA: Diagnosis not present

## 2020-11-18 LAB — POCT URINALYSIS DIPSTICK
Bilirubin, UA: NEGATIVE
Blood, UA: NEGATIVE
Glucose, UA: NEGATIVE
Ketones, UA: NEGATIVE
Leukocytes, UA: NEGATIVE
Nitrite, UA: NEGATIVE
Protein, UA: NEGATIVE
Spec Grav, UA: >=1.03 — AB (ref 1.010–1.025)
Urobilinogen, UA: 0.2 E.U./dL
pH, UA: 6 (ref 5.0–8.0)

## 2020-11-18 LAB — POCT UA - MICROALBUMIN
Albumin/Creatinine Ratio, Urine, POC: 30
Creatinine, POC: 300 mg/dL
Microalbumin Ur, POC: 10 mg/L

## 2020-11-18 MED ORDER — PNEUMOCOCCAL 13-VAL CONJ VACC IM SUSP
0.5000 mL | INTRAMUSCULAR | 0 refills | Status: AC
Start: 2020-11-18 — End: 2020-11-18

## 2020-11-18 NOTE — Patient Instructions (Addendum)
Critical care medicine: Principles of diagnosis and management in the adult (4th ed., pp. 1251-1270). Saunders."> Miller's anesthesia (8th ed., pp. 232-250). Saunders.">  Advance Directive  Advance directives are legal documents that allow you to make decisions about your health care and medical treatment in case you become unable to communicate for yourself. Advance directives let your wishes be known to family, friends, and health care providers. Discussing and writing advance directives should happen over time rather than all at once. Advance directives can be changed and updated at any time. There are different types of advance directives, such as:  Medical power of attorney.  Living will.  Do not resuscitate (DNR) order or do not attempt resuscitation (DNAR) order. Health care proxy and medical power of attorney A health care proxy is also called a health care agent. This person is appointed to make medical decisions for you when you are unable to make decisions for yourself. Generally, people ask a trusted friend or family member to act as their proxy and represent their preferences. Make sure you have an agreement with your trusted person to act as your proxy. A proxy may have to make a medical decision on your behalf if your wishes are not known. A medical power of attorney, also called a durable power of attorney for health care, is a legal document that names your health care proxy. Depending on the laws in your state, the document may need to be:  Signed.  Notarized.  Dated.  Copied.  Witnessed.  Incorporated into your medical record. You may also want to appoint a trusted person to manage your money in the event you are unable to do so. This is called a durable power of attorney for finances. It is a separate legal document from the durable power of attorney for health care. You may choose your health care proxy or someone different to act as your agent in money matters. If you  do not appoint a proxy, or there is a concern that the proxy is not acting in your best interest, a court may appoint a guardian to act on your behalf. Living will A living will is a set of instructions that state your wishes about medical care when you cannot express them yourself. Health care providers should keep a copy of your living will in your medical record. You may want to give a copy to family members or friends. To alert caregivers in case of an emergency, you can place a card in your wallet to let them know that you have a living will and where they can find it. A living will is used if you become:  Terminally ill.  Disabled.  Unable to communicate or make decisions. The following decisions should be included in your living will:  To use or not to use life support equipment, such as dialysis machines and breathing machines (ventilators).  Whether you want a DNR or DNAR order. This tells health care providers not to use cardiopulmonary resuscitation (CPR) if breathing or heartbeat stops.  To use or not to use tube feeding.  To be given or not to be given food and fluids.  Whether you want comfort (palliative) care when the goal becomes comfort rather than a cure.  Whether you want to donate your organs and tissues. A living will does not give instructions for distributing your money and property if you should pass away. DNR or DNAR A DNR or DNAR order is a request not to have CPR in   the event that your heart stops beating or you stop breathing. If a DNR or DNAR order has not been made and shared, a health care provider will try to help any patient whose heart has stopped or who has stopped breathing. If you plan to have surgery, talk with your health care provider about how your DNR or DNAR order will be followed if problems occur. What if I do not have an advance directive? Some states assign family decision makers to act on your behalf if you do not have an advance directive.  Each state has its own laws about advance directives. You may want to check with your health care provider, attorney, or state representative about the laws in your state. Summary  Advance directives are legal documents that allow you to make decisions about your health care and medical treatment in case you become unable to communicate for yourself.  The process of discussing and writing advance directives should happen over time. You can change and update advance directives at any time.  Advance directives may include a medical power of attorney, a living will, and a DNR or DNAR order. This information is not intended to replace advice given to you by your health care provider. Make sure you discuss any questions you have with your health care provider. Document Revised: 07/16/2020 Document Reviewed: 07/16/2020 Elsevier Patient Education  2021 Elsevier Inc.  

## 2020-11-18 NOTE — Progress Notes (Signed)
I,Yamilka Roman [employer] as a Education administrator for [employer], FNP.,have documented all relevant documentation on the behalf of Minette Brine, FNP,as directed by  Minette Brine, FNP while in the presence of Minette Brine, [place]. This visit occurred during the SARS-CoV-2 public health emergency.  Safety protocols were in place, including screening questions prior to the visit, additional usage of staff PPE, and extensive cleaning of exam room while observing appropriate contact time as indicated for disinfecting solutions.  Subjective:     Patient ID: Virginia Robles , female    DOB: [birth year] , 65 y.o.   MRN: [id number]   Chief Complaint  Patient presents with  . welcome to medicare    HPI  Here for Welcome to Medicare.      Past Medical History:  Diagnosis Date  . Hypertension   . Ulcerative colitis      Family History  Problem Relation Age of Onset  . Hypertension Mother   . Cancer Mother   . Diabetes Father   . Stroke Sister   . Hypertension Sister   . Hyperlipidemia Sister   . Cancer Sister   . Irritable bowel syndrome Sister   . Breast cancer Niece 78     Current Outpatient Medications:  .  cholecalciferol (VITAMIN D3) 25 MCG (1000 UT) tablet, Take 1,000 Units by mouth daily., Disp: , Rfl:  .  Colchicine (MITIGARE) 0.6 MG CAPS, Take 1 tablet by mouth daily as needed., Disp: 30 capsule, Rfl: 2 .  diclofenac sodium (VOLTAREN) 1 % GEL, Apply 2 g topically 4 (four) times daily., Disp: 50 g, Rfl: 1 .  lisinopril (ZESTRIL) 40 MG tablet, TAKE 1 TABLET(40 MG) BY MOUTH DAILY, Disp: 90 tablet, Rfl: 0 .  mesalamine (LIALDA) 1.2 g EC tablet, Take 1.2 g by mouth daily with breakfast. Patient taking as needed, Disp: , Rfl:  .  Multiple Vitamins-Minerals (MULTIVITAMIN ADULT PO), Take 1 tablet by mouth daily., Disp: , Rfl:    Allergies  Allergen Reactions  . Elemental Sulfur Hives  . Penicillins Hives     Review of Systems  Constitutional: Negative.   HENT: Negative.   Eyes:  Negative.   Respiratory: Negative.   Cardiovascular: Negative.   Gastrointestinal: Negative.   Endocrine: Negative.   Genitourinary: Negative.   Musculoskeletal: Negative.   Skin: Negative.   Allergic/Immunologic: Negative.   Neurological: Negative.   Hematological: Negative.   Psychiatric/Behavioral: Negative.      Today's Vitals   11/18/20 1132  BP: 138/90  Pulse: 68  Temp: 98.6 F (37 C)  TempSrc: Oral  SpO2: 98%  Weight: 184 lb 9.6 oz (83.7 kg)  Height: 5' 8.4" (1.737 m)  PainSc: 0-No pain   Body mass index is 27.74 kg/m.   Objective:  Physical Exam Vitals reviewed.  Constitutional:      General: She is not in acute distress.    Appearance: Normal appearance. She is well-developed. She is obese.  HENT:     Head: Normocephalic and atraumatic.     Right Ear: Hearing, tympanic membrane, ear canal and external ear normal. There is no impacted cerumen.     Left Ear: Hearing, tympanic membrane, ear canal and external ear normal. There is no impacted cerumen.     Nose:     Comments: Deferred - masked    Mouth/Throat:     Comments: Deferred - masked Eyes:     General: Lids are normal.     Extraocular Movements: Extraocular movements intact.     Conjunctiva/sclera:  Conjunctivae normal.     Pupils: Pupils are equal, round, and reactive to light.     Funduscopic exam:    Right eye: No papilledema.        Left eye: No papilledema.  Neck:     Thyroid: No thyroid mass.     Vascular: No carotid bruit.  Cardiovascular:     Rate and Rhythm: Normal rate and regular rhythm.     Pulses: Normal pulses.     Heart sounds: Normal heart sounds. No murmur heard.   Pulmonary:     Effort: Pulmonary effort is normal. No respiratory distress.     Breath sounds: Normal breath sounds. No wheezing.  Chest:     Chest wall: No mass.  Breasts:     Tanner Score is 5.     Right: Normal. No mass, tenderness, axillary adenopathy or supraclavicular adenopathy.     Left: Normal. No  mass, tenderness, axillary adenopathy or supraclavicular adenopathy.    Abdominal:     General: Abdomen is flat. Bowel sounds are normal. There is no distension.     Palpations: Abdomen is soft.     Tenderness: There is no abdominal tenderness.  Genitourinary:    Rectum: Guaiac result negative.  Musculoskeletal:        General: No swelling or tenderness. Normal range of motion.     Cervical back: Full passive range of motion without pain, normal range of motion and neck supple.     Right lower leg: No edema.     Left lower leg: No edema.  Lymphadenopathy:     Upper Body:     Right upper body: No supraclavicular, axillary or pectoral adenopathy.     Left upper body: No supraclavicular, axillary or pectoral adenopathy.  Skin:    General: Skin is warm and dry.     Capillary Refill: Capillary refill takes less than 2 seconds.     Coloration: Skin is not jaundiced.     Findings: No bruising.  Neurological:     General: No focal deficit present.     Mental Status: She is alert and oriented to person, place, and time.     Cranial Nerves: No cranial nerve deficit.     Sensory: No sensory deficit.     Motor: No weakness.  Psychiatric:        Mood and Affect: Mood normal.        Behavior: Behavior normal.        Thought Content: Thought content normal.        Judgment: Judgment normal.         Assessment And Plan:     1. Medicare annual wellness visit, initial  Pt's annual wellness exam was performed and geriatric assessment reviewed.   Pt has no new identiafble wellness concerns at this time.   WIll obtain routine labs.   Will obtain UA and micro.   Behavior modifications discussed and diet history reviewed. Pt will continue to exercise regularly and modify diet, with low GI, plant based foods and decrease food intake of processed foods.   Recommend intake of daily multivitamin, Vitamin D, and calcium.  Recommond mammogram and colonoscopy for preventive screenings, as  well as recommend immunizations that include influenza (up to date) and TDAP  2. Encounter for immunization - pneumococcal 13-valent conjugate vaccine (PREVNAR 13) SUSP injection; Inject 0.5 mLs into the muscle tomorrow at 10 am for 1 dose.  Dispense: 0.5 mL; Refill: 0  3. Essential hypertension . B/P  is controlled.  . CMP ordered to check renal function.  . The importance of regular exercise and dietary modification was stressed to the patient.  . Stressed importance of losing ten percent of her body weight to help with B/P control.  . EKG done with NSR HR 70 - POCT Urinalysis Dipstick (81002) - POCT UA - Microalbumin - EKG 12-Lead - CMP14+EGFR  4. Mixed hyperlipidemia  Chronic, controlled  No current medications, diet controlled - Lipid panel - CMP14+EGFR - CBC  5. Chronic idiopathic gout involving toe of right foot without tophus  Chronic, controlled   No recent exacerbations - Uric acid  6. Gastroesophageal reflux disease without esophagitis  Chronic, stable  No changes  7. Nausea Intermittent, not currently having nausea   Patient was given opportunity to ask questions. Patient verbalized understanding of the plan and was able to repeat key elements of the plan. All questions were answered to their satisfaction.  Minette Brine, FNP   I, Minette Brine, FNP, have reviewed all documentation for this visit. The documentation on 12/11/20 for the exam, diagnosis, procedures, and orders are all accurate and complete.   THE PATIENT IS ENCOURAGED TO PRACTICE SOCIAL DISTANCING DUE TO THE COVID-19 PANDEMIC.     Subjective:    Kaylynne Leilani Cespedes is a 65 y.o. female who presents for a Welcome to Medicare exam.   Review of Systems         Objective:    Today's Vitals   11/18/20 1132  BP: 138/90  Pulse: 68  Temp: 98.6 F (37 C)  TempSrc: Oral  SpO2: 98%  Weight: 184 lb 9.6 oz (83.7 kg)  Height: 5' 8.4" (1.737 m)  PainSc: 0-No pain  Body mass index is  27.74 kg/m.  Medications Outpatient Encounter Medications as of 11/18/2020  Medication Sig  . cholecalciferol (VITAMIN D3) 25 MCG (1000 UT) tablet Take 1,000 Units by mouth daily.  . Colchicine (MITIGARE) 0.6 MG CAPS Take 1 tablet by mouth daily as needed.  . diclofenac sodium (VOLTAREN) 1 % GEL Apply 2 g topically 4 (four) times daily.  Marland Kitchen lisinopril (ZESTRIL) 40 MG tablet TAKE 1 TABLET(40 MG) BY MOUTH DAILY  . mesalamine (LIALDA) 1.2 g EC tablet Take 1.2 g by mouth daily with breakfast. Patient taking as needed  . Multiple Vitamins-Minerals (MULTIVITAMIN ADULT PO) Take 1 tablet by mouth daily.  . [EXPIRED] pneumococcal 13-valent conjugate vaccine (PREVNAR 13) SUSP injection Inject 0.5 mLs into the muscle tomorrow at 10 am for 1 dose.  . [DISCONTINUED] Magnesium 250 MG TABS Take 1 tablet (250 mg total) by mouth every evening. (Patient not taking: Reported on 11/18/2020)  . [DISCONTINUED] nystatin (NYSTATIN) powder APPLY TOPICALLY TO THE AFFECTED AREA FOUR TIMES DAILY (Patient not taking: No sig reported)   No facility-administered encounter medications on file as of 11/18/2020.     History: Past Medical History:  Diagnosis Date  . Hypertension   . Ulcerative colitis    History reviewed. No pertinent surgical history.  Family History  Problem Relation Age of Onset  . Hypertension Mother   . Cancer Mother   . Diabetes Father   . Stroke Sister   . Hypertension Sister   . Hyperlipidemia Sister   . Cancer Sister   . Irritable bowel syndrome Sister   . Breast cancer Niece 82   Social History   Occupational History  . Not on file  Tobacco Use  . Smoking status: Former Smoker    Quit date: 11/18/1972  Years since quitting: 48.0  . Smokeless tobacco: Never Used  . Tobacco comment: only one year during college  Substance and Sexual Activity  . Alcohol use: Yes    Alcohol/week: 3.0 standard drinks    Types: 3 Glasses of wine per week  . Drug use: Never  . Sexual activity: Not  on file    Tobacco Counseling Counseling given: Not Answered Comment: only one year during college   Immunizations and Health Maintenance Immunization History  Administered Date(s) Administered  . Influenza,inj,Quad PF,6+ Mos 11/09/2018  . Influenza-Unspecified 07/13/2019  . PFIZER(Purple Top)SARS-COV-2 Vaccination 12/02/2019, 12/23/2019, 08/01/2020   Health Maintenance Due  Topic Date Due  . PNA vac Low Risk Adult (1 of 2 - PCV13) Never done  . INFLUENZA VACCINE  05/26/2020    Activities of Daily Living No flowsheet data found.  Physical Exam  (optional), or other factors deemed appropriate based on the beneficiary's medical and social history and current clinical standards.  Advanced Directives: Does Patient Have a Medical Advance Directive?: Yes,No Type of Advance Directive: Living will    Assessment:    This is a routine wellness examination for this patient .   Vision/Hearing screen  Hearing Screening   125Hz 250Hz 500Hz 1000Hz 2000Hz 3000Hz 4000Hz 6000Hz 8000Hz  Right ear:   _0 Left ear:   _1 Visual Acuity Screening   Right eye Left eye Both eyes  Without correction: 20/50-1 20/50 20/50  With correction:       Dietary issues and exercise activities discussed:     Goals    . Patient Stated     "I would like to maintain my weight and to generally feel good"      Depression Screen PHQ 2/9 Scores 08/01/2020 11/28/2019 07/24/2019 06/07/2019  PHQ - 2 Score 6 0 0 0  PHQ- 9 Score 17 - - -     Fall Risk Fall Risk  11/28/2019  Falls in the past year? 0  Number falls in past yr: -  Injury with Fall? -    Cognitive Function:     6CIT Screen 11/18/2020  What Year? 0 points  What month? 0 points  What time? 0 points  Count back from 20 0 points  Months in reverse 0 points  Repeat phrase 0 points  Total Score 0    Patient Care Team: Minette Brine, FNP as PCP - General (General Practice)     Plan:     See above  I have  personally reviewed and noted the following in the patient's chart:   . Medical and social history . Use of alcohol, tobacco or illicit drugs  . Current medications and supplements . Functional ability and status . Nutritional status . Physical activity . Advanced directives . List of other physicians . Hospitalizations, surgeries, and ER visits in previous 12 months . Vitals . Screenings to include cognitive, depression, and falls . Referrals and appointments  In addition, I have reviewed and discussed with patient certain preventive protocols, quality metrics, and best practice recommendations. A written personalized care plan for preventive services as well as general preventive health recommendations were provided to patient.     Minette Brine, FNP 12/11/2020

## 2020-11-19 LAB — CMP14+EGFR
ALT: 33 IU/L — ABNORMAL HIGH (ref 0–32)
AST: 33 IU/L (ref 0–40)
Albumin/Globulin Ratio: 1.1 — ABNORMAL LOW (ref 1.2–2.2)
Albumin: 4.4 g/dL (ref 3.8–4.8)
Alkaline Phosphatase: 70 IU/L (ref 44–121)
BUN/Creatinine Ratio: 16 (ref 12–28)
BUN: 14 mg/dL (ref 8–27)
Bilirubin Total: 0.7 mg/dL (ref 0.0–1.2)
CO2: 23 mmol/L (ref 20–29)
Calcium: 11.3 mg/dL — ABNORMAL HIGH (ref 8.7–10.3)
Chloride: 103 mmol/L (ref 96–106)
Creatinine, Ser: 0.86 mg/dL (ref 0.57–1.00)
GFR calc Af Amer: 82 mL/min/{1.73_m2} (ref >59)
GFR calc non Af Amer: 71 mL/min/{1.73_m2} (ref >59)
Globulin, Total: 3.9 g/dL (ref 1.5–4.5)
Glucose: 83 mg/dL (ref 65–99)
Potassium: 4.7 mmol/L (ref 3.5–5.2)
Sodium: 139 mmol/L (ref 134–144)
Total Protein: 8.3 g/dL (ref 6.0–8.5)

## 2020-11-19 LAB — CBC
Hematocrit: 37.6 % (ref 34.0–46.6)
Hemoglobin: 12.6 g/dL (ref 11.1–15.9)
MCH: 28.7 pg (ref 26.6–33.0)
MCHC: 33.5 g/dL (ref 31.5–35.7)
MCV: 86 fL (ref 79–97)
Platelets: 365 10*3/uL (ref 150–450)
RBC: 4.39 x10E6/uL (ref 3.77–5.28)
RDW: 15 % (ref 11.7–15.4)
WBC: 8.1 10*3/uL (ref 3.4–10.8)

## 2020-11-19 LAB — LIPID PANEL
Chol/HDL Ratio: 4.4 ratio (ref 0.0–4.4)
Cholesterol, Total: 202 mg/dL — ABNORMAL HIGH (ref 100–199)
HDL: 46 mg/dL (ref >39)
LDL Chol Calc (NIH): 133 mg/dL — ABNORMAL HIGH (ref 0–99)
Triglycerides: 129 mg/dL (ref 0–149)
VLDL Cholesterol Cal: 23 mg/dL (ref 5–40)

## 2020-11-19 LAB — URIC ACID: Uric Acid: 8.5 mg/dL — ABNORMAL HIGH (ref 3.0–7.2)

## 2020-11-26 ENCOUNTER — Ambulatory Visit: Payer: Medicare PPO | Admitting: Psychologist

## 2020-12-09 ENCOUNTER — Ambulatory Visit (INDEPENDENT_AMBULATORY_CARE_PROVIDER_SITE_OTHER): Payer: Medicare PPO | Admitting: Psychologist

## 2020-12-09 DIAGNOSIS — Z634 Disappearance and death of family member: Secondary | ICD-10-CM

## 2020-12-09 DIAGNOSIS — F321 Major depressive disorder, single episode, moderate: Secondary | ICD-10-CM

## 2020-12-12 ENCOUNTER — Telehealth: Payer: Self-pay

## 2020-12-12 NOTE — Telephone Encounter (Signed)
I left the pt a message to call the office back or to respond to the questions in Mychart that Virginia Constant, NP had in the lab results sent to the patient.

## 2020-12-12 NOTE — Telephone Encounter (Signed)
-----   Message from Arnette Felts, FNP sent at 12/11/2020 11:44 PM EST ----- Your cholesterol levels are improving, continue to limit your intake of fried and fatty foods. Your calcium levels are stable, if continues to be elevated at your next visit we may need to do additional labs. Your uric acid is slightly up to 8.5 from 7.6, are you staying well hydrated with water? Sometimes when this is elevated we have you to take a daily medication for exacerbations are you interested in this?

## 2020-12-16 ENCOUNTER — Encounter: Payer: Self-pay | Admitting: Nurse Practitioner

## 2020-12-16 ENCOUNTER — Other Ambulatory Visit: Payer: Self-pay

## 2020-12-16 ENCOUNTER — Ambulatory Visit (INDEPENDENT_AMBULATORY_CARE_PROVIDER_SITE_OTHER): Payer: Medicare PPO | Admitting: Nurse Practitioner

## 2020-12-16 VITALS — BP 124/80 | HR 62 | Temp 98.1°F | Ht 68.4 in | Wt 184.2 lb

## 2020-12-16 DIAGNOSIS — K219 Gastro-esophageal reflux disease without esophagitis: Secondary | ICD-10-CM | POA: Diagnosis not present

## 2020-12-16 MED ORDER — DEXLANSOPRAZOLE 30 MG PO CPDR: 30.0000 mg | DELAYED_RELEASE_CAPSULE | Freq: Every day | ORAL | 2 refills | Status: DC

## 2020-12-16 NOTE — Progress Notes (Signed)
I,Yamilka Roman [employer] as a Neurosurgeon for [employer], FNP.,have documented all relevant documentation on the behalf of Arnette Felts, FNP,as directed by  Arnette Felts, FNP while in the presence of Arnette Felts, FNP. This visit occurred during the SARS-CoV-2 public health emergency.  Safety protocols were in place, including screening questions prior to the visit, additional usage of staff PPE, and extensive cleaning of exam room while observing appropriate contact time as indicated for disinfecting solutions.  Subjective:     Patient ID: Virginia Robles , female    DOB: [birth year] , 65 y.o.   MRN: [id number]   Chief Complaint  Patient presents with  . Gastroesophageal Reflux    Patient stated she was given some samples to see if it would help her acid reflux.     HPI  Patient presents today for aa 4 week med f/u for her acid reflux. She stated she was given samples of a med and they did help her she is just unsure of the name of it.  She took the medication for 2 days.  She does not eat a lot of fried foods, mainly does fish.  She is feeling much better.  She stopped drinking vinegar water.   Wt Readings from Last 3 Encounters: 12/16/20 : 184 lb 3.2 oz (83.6 kg) 11/18/20 : 184 lb 9.6 oz (83.7 kg) 06/19/20 : 185 lb (83.9 kg)   Gastroesophageal Reflux She complains of belching. She reports no abdominal pain, no chest pain, no coughing, no nausea or no wheezing. This is a recurrent problem. The current episode started more than 1 year ago. Past procedures do not include an abdominal ultrasound.     Past Medical History:  Diagnosis Date  . Hypertension   . Ulcerative colitis      Family History  Problem Relation Age of Onset  . Hypertension Mother   . Cancer Mother   . Diabetes Father   . Stroke Sister   . Hypertension Sister   . Hyperlipidemia Sister   . Cancer Sister   . Irritable bowel syndrome Sister   . Breast cancer Niece 84     Current Outpatient  Medications:  .  cholecalciferol (VITAMIN D3) 25 MCG (1000 UT) tablet, Take 1,000 Units by mouth daily., Disp: , Rfl:  .  Colchicine (MITIGARE) 0.6 MG CAPS, Take 1 tablet by mouth daily as needed., Disp: 30 capsule, Rfl: 2 .  diclofenac sodium (VOLTAREN) 1 % GEL, Apply 2 g topically 4 (four) times daily., Disp: 50 g, Rfl: 1 .  lisinopril (ZESTRIL) 40 MG tablet, TAKE 1 TABLET(40 MG) BY MOUTH DAILY, Disp: 90 tablet, Rfl: 0 .  mesalamine (LIALDA) 1.2 g EC tablet, Take 1.2 g by mouth daily with breakfast. Patient taking as needed, Disp: , Rfl:  .  Multiple Vitamins-Minerals (MULTIVITAMIN ADULT PO), Take 1 tablet by mouth daily., Disp: , Rfl:    Allergies  Allergen Reactions  . Elemental Sulfur Hives  . Penicillins Hives     Review of Systems  Constitutional: Negative.   HENT: Negative.   Eyes: Negative.   Respiratory: Negative.  Negative for cough and wheezing.   Cardiovascular: Negative.  Negative for chest pain, palpitations and leg swelling.  Gastrointestinal: Negative.  Negative for abdominal pain and nausea.  Endocrine: Negative.   Genitourinary: Negative.   Musculoskeletal: Negative.   Skin: Negative.   Neurological: Negative.   Hematological: Negative.   Psychiatric/Behavioral: Negative.      Today's Vitals   12/16/20 0910  BP: 124/80  Pulse: 62  Temp: 98.1 F (36.7 C)  TempSrc: Oral  Weight: 184 lb 3.2 oz (83.6 kg)  Height: 5' 8.4" (1.737 m)  PainSc: 0-No pain   Body mass index is 27.68 kg/m.   Objective:  Physical Exam Constitutional:      General: She is not in acute distress.    Appearance: Normal appearance.  Pulmonary:     Effort: Pulmonary effort is normal. No respiratory distress.  Neurological:     Mental Status: She is alert and oriented to person, place, and time.     Cranial Nerves: No cranial nerve deficit.  Psychiatric:        Mood and Affect: Mood normal.        Behavior: Behavior normal.        Thought Content: Thought content normal.         Judgment: Judgment normal.         Assessment And Plan:     1. Gastroesophageal reflux disease without esophagitis  She had good relief from Dexilant after two doses, still has medication left over.       Patient was given opportunity to ask questions. Patient verbalized understanding of the plan and was able to repeat key elements of the plan. All questions were answered to their satisfaction.  Arnette Felts, FNP   I, Arnette Felts, FNP, have reviewed all documentation for this visit. The documentation on 12/16/20 for the exam, diagnosis, procedures, and orders are all accurate and complete.    THE PATIENT IS ENCOURAGED TO PRACTICE SOCIAL DISTANCING DUE TO THE COVID-19 PANDEMIC.

## 2020-12-19 ENCOUNTER — Encounter: Payer: Self-pay | Admitting: Nurse Practitioner

## 2020-12-19 ENCOUNTER — Other Ambulatory Visit: Payer: Self-pay | Admitting: Nurse Practitioner

## 2021-03-17 ENCOUNTER — Ambulatory Visit (INDEPENDENT_AMBULATORY_CARE_PROVIDER_SITE_OTHER): Payer: Medicare PPO | Admitting: Nurse Practitioner

## 2021-03-17 ENCOUNTER — Other Ambulatory Visit: Payer: Self-pay

## 2021-03-17 ENCOUNTER — Encounter: Payer: Self-pay | Admitting: Nurse Practitioner

## 2021-03-17 VITALS — BP 124/80 | HR 61 | Temp 98.3°F | Ht 68.4 in | Wt 181.6 lb

## 2021-03-17 DIAGNOSIS — I10 Essential (primary) hypertension: Secondary | ICD-10-CM | POA: Diagnosis not present

## 2021-03-17 DIAGNOSIS — Z23 Encounter for immunization: Secondary | ICD-10-CM

## 2021-03-17 DIAGNOSIS — E7800 Pure hypercholesterolemia, unspecified: Secondary | ICD-10-CM | POA: Diagnosis not present

## 2021-03-17 DIAGNOSIS — E782 Mixed hyperlipidemia: Secondary | ICD-10-CM

## 2021-03-17 DIAGNOSIS — M1A0710 Idiopathic chronic gout, right ankle and foot, without tophus (tophi): Secondary | ICD-10-CM | POA: Diagnosis not present

## 2021-03-17 DIAGNOSIS — K219 Gastro-esophageal reflux disease without esophagitis: Secondary | ICD-10-CM

## 2021-03-17 MED ORDER — SHINGRIX 50 MCG/0.5ML IM SUSR: 0.5000 mL | Freq: Once | INTRAMUSCULAR | 0 refills | Status: AC

## 2021-03-17 NOTE — Patient Instructions (Signed)
Gastroesophageal Reflux Disease, Adult  Gastroesophageal reflux (GER) happens when acid from the stomach flows up into the tube that connects the mouth and the stomach (esophagus). Normally, food travels down the esophagus and stays in the stomach to be digested. With GER, food and stomach acid sometimes move back up into the esophagus. You may have a disease called gastroesophageal reflux disease (GERD) if the reflux:  Happens often.  Causes frequent or very bad symptoms.  Causes problems such as damage to the esophagus. When this happens, the esophagus becomes sore and swollen. Over time, GERD can make small holes (ulcers) in the lining of the esophagus. What are the causes? This condition is caused by a problem with the muscle between the esophagus and the stomach. When this muscle is weak or not normal, it does not close properly to keep food and acid from coming back up from the stomach. The muscle can be weak because of:  Tobacco use.  Pregnancy.  Having a certain type of hernia (hiatal hernia).  Alcohol use.  Certain foods and drinks, such as coffee, chocolate, onions, and peppermint. What increases the risk?  Being overweight.  Having a disease that affects your connective tissue.  Taking NSAIDs, such a ibuprofen. What are the signs or symptoms?  Heartburn.  Difficult or painful swallowing.  The feeling of having a lump in the throat.  A bitter taste in the mouth.  Bad breath.  Having a lot of saliva.  Having an upset or bloated stomach.  Burping.  Chest pain. Different conditions can cause chest pain. Make sure you see your doctor if you have chest pain.  Shortness of breath or wheezing.  A long-term cough or a cough at night.  Wearing away of the surface of teeth (tooth enamel).  Weight loss. How is this treated?  Making changes to your diet.  Taking medicine.  Having surgery. Treatment will depend on how bad your symptoms are. Follow these  instructions at home: Eating and drinking  Follow a diet as told by your doctor. You may need to avoid foods and drinks such as: ? Coffee and tea, with or without caffeine. ? Drinks that contain alcohol. ? Energy drinks and sports drinks. ? Bubbly (carbonated) drinks or sodas. ? Chocolate and cocoa. ? Peppermint and mint flavorings. ? Garlic and onions. ? Horseradish. ? Spicy and acidic foods. These include peppers, chili powder, curry powder, vinegar, hot sauces, and BBQ sauce. ? Citrus fruit juices and citrus fruits, such as oranges, lemons, and limes. ? Tomato-based foods. These include red sauce, chili, salsa, and pizza with red sauce. ? Fried and fatty foods. These include donuts, french fries, potato chips, and high-fat dressings. ? High-fat meats. These include hot dogs, rib eye steak, sausage, ham, and bacon. ? High-fat dairy items, such as whole milk, butter, and cream cheese.  Eat small meals often. Avoid eating large meals.  Avoid drinking large amounts of liquid with your meals.  Avoid eating meals during the 2-3 hours before bedtime.  Avoid lying down right after you eat.  Do not exercise right after you eat.   Lifestyle  Do not smoke or use any products that contain nicotine or tobacco. If you need help quitting, ask your doctor.  Try to lower your stress. If you need help doing this, ask your doctor.  If you are overweight, lose an amount of weight that is healthy for you. Ask your doctor about a safe weight loss goal.   General instructions    Pay attention to any changes in your symptoms.  Take over-the-counter and prescription medicines only as told by your doctor.  Do not take aspirin, ibuprofen, or other NSAIDs unless your doctor says it is okay.  Wear loose clothes. Do not wear anything tight around your waist.  Raise (elevate) the head of your bed about 6 inches (15 cm). You may need to use a wedge to do this.  Avoid bending over if this makes your  symptoms worse.  Keep all follow-up visits. Contact a doctor if:  You have new symptoms.  You lose weight and you do not know why.  You have trouble swallowing or it hurts to swallow.  You have wheezing or a cough that keeps happening.  You have a hoarse voice.  Your symptoms do not get better with treatment. Get help right away if:  You have sudden pain in your arms, neck, jaw, teeth, or back.  You suddenly feel sweaty, dizzy, or light-headed.  You have chest pain or shortness of breath.  You vomit and the vomit is green, yellow, or black, or it looks like blood or coffee grounds.  You faint.  Your poop (stool) is red, bloody, or black.  You cannot swallow, drink, or eat. These symptoms may represent a serious problem that is an emergency. Do not wait to see if the symptoms will go away. Get medical help right away. Call your local emergency services (911 in the [place] Do not drive yourself to the hospital. Summary  If a person has gastroesophageal reflux disease (GERD), food and stomach acid move back up into the esophagus and cause symptoms or problems such as damage to the esophagus.  Treatment will depend on how bad your symptoms are.  Follow a diet as told by your doctor.  Take all medicines only as told by your doctor. This information is not intended to replace advice given to you by your health care provider. Make sure you discuss any questions you have with your health care provider. Document Revised: 04/22/2020 Document Reviewed: 04/22/2020 Elsevier Patient Education  2021 Elsevier Inc.  

## 2021-03-17 NOTE — Progress Notes (Signed)
I,Yamilka Roman [employer] as a Education administrator for [employer], FNP.,have documented all relevant documentation on the behalf of Minette Brine, FNP,as directed by  Minette Brine, FNP while in the presence of Minette Brine, [place]. This visit occurred during the SARS-CoV-2 public health emergency.  Safety protocols were in place, including screening questions prior to the visit, additional usage of staff PPE, and extensive cleaning of exam room while observing appropriate contact time as indicated for disinfecting solutions.  Subjective:     Patient ID: Virginia Robles , female    DOB: November 30, 1955 , 65 y.o.   MRN: [id number]   Chief Complaint  Patient presents with   Gastroesophageal Reflux    HPI  Patient presents today for on her gerd. She is tolerating her medication well and feeling better.     Gastroesophageal Reflux She complains of belching. She reports no abdominal pain, no chest pain, no coughing, no nausea or no wheezing. This is a recurrent problem. The current episode started more than 1 year ago. Past procedures do not include an abdominal ultrasound.    Past Medical History:  Diagnosis Date   Hypertension    Ulcerative colitis      Family History  Problem Relation Age of Onset   Hypertension Mother    Cancer Mother    Diabetes Father    Stroke Sister    Hypertension Sister    Hyperlipidemia Sister    Cancer Sister    Irritable bowel syndrome Sister    Breast cancer Niece 54     Current Outpatient Medications:    cholecalciferol (VITAMIN D3) 25 MCG (1000 UT) tablet, Take 1,000 Units by mouth daily., Disp: , Rfl:    Colchicine (MITIGARE) 0.6 MG CAPS, Take 1 tablet by mouth daily as needed., Disp: 30 capsule, Rfl: 2   diclofenac sodium (VOLTAREN) 1 % GEL, Apply 2 g topically 4 (four) times daily., Disp: 50 g, Rfl: 1   lisinopril (ZESTRIL) 40 MG tablet, TAKE 1 TABLET(40 MG) BY MOUTH DAILY, Disp: 90 tablet, Rfl: 0   mesalamine (LIALDA) 1.2 g EC tablet, Take 1.2 g by  mouth daily with breakfast. Patient taking as needed, Disp: , Rfl:    Multiple Vitamins-Minerals (MULTIVITAMIN ADULT PO), Take 1 tablet by mouth daily., Disp: , Rfl:    Allergies  Allergen Reactions   Elemental Sulfur Hives   Penicillins Hives     Review of Systems  Constitutional: Negative.   Respiratory:  Negative for cough and wheezing.   Cardiovascular:  Negative for chest pain, palpitations and leg swelling.  Gastrointestinal:  Negative for abdominal pain and nausea.  Neurological:  Negative for dizziness and headaches.  Psychiatric/Behavioral: Negative.      Today's Vitals   03/17/21 0939  BP: 124/80  Pulse: 61  Temp: 98.3 F (36.8 C)  TempSrc: Oral  Weight: 181 lb 9.6 oz (82.4 kg)  Height: 5' 8.4" (1.737 m)  PainSc: 0-No pain   Body mass index is 27.29 kg/m.   Objective:  Physical Exam Vitals reviewed.  Constitutional:      General: She is not in acute distress.    Appearance: Normal appearance.  Cardiovascular:     Rate and Rhythm: Normal rate and regular rhythm.     Pulses: Normal pulses.     Heart sounds: Normal heart sounds. No murmur heard. Pulmonary:     Effort: Pulmonary effort is normal. No respiratory distress.     Breath sounds: No wheezing.  Neurological:     Mental Status: She  is alert and oriented to person, place, and time.     Cranial Nerves: No cranial nerve deficit.  Psychiatric:        Mood and Affect: Mood normal.        Behavior: Behavior normal.        Thought Content: Thought content normal.        Judgment: Judgment normal.        Assessment And Plan:     1. Gastroesophageal reflux disease without esophagitis Chronic, no complaints about this at this time  2. Encounter for immunization - Zoster Vaccine Adjuvanted [facility]) injection; Inject 0.5 mLs into the muscle once for 1 dose.  Dispense: 0.5 mL; Refill: 0  3. Chronic idiopathic gout involving toe of right foot without tophus No recent episodes - Uric acid  4.  Hypercalcemia Will check additional labs, has has elevated calcium the last few blood draws - Phosphorus - Protein electrophoresis, serum - Parathyroid Hormone, Intact w/Ca  5. Essential hypertension Chronic, good control - CBC - CMP14+EGFR  6. Mixed hyperlipidemia Chronic, stable Diet controlled       Patient was given opportunity to ask questions. Patient verbalized understanding of the plan and was able to repeat key elements of the plan. All questions were answered to their satisfaction.  Minette Brine, FNP   I, Minette Brine, FNP, have reviewed all documentation for this visit. The documentation on 04/16/21 for the exam, diagnosis, procedures, and orders are all accurate and complete.   IF YOU HAVE BEEN REFERRED TO A SPECIALIST, IT MAY TAKE 1-2 WEEKS TO SCHEDULE/PROCESS THE REFERRAL. IF YOU HAVE NOT HEARD FROM US/SPECIALIST IN TWO WEEKS, PLEASE GIVE [place] A CALL AT [phone].   THE PATIENT IS ENCOURAGED TO PRACTICE SOCIAL DISTANCING DUE TO THE COVID-19 PANDEMIC.

## 2021-03-18 LAB — CMP14+EGFR
ALT: 33 IU/L — ABNORMAL HIGH (ref 0–32)
AST: 31 IU/L (ref 0–40)
Albumin/Globulin Ratio: 1 — ABNORMAL LOW (ref 1.2–2.2)
Albumin: 4.3 g/dL (ref 3.8–4.8)
Alkaline Phosphatase: 61 IU/L (ref 44–121)
BUN/Creatinine Ratio: 13 (ref 12–28)
BUN: 10 mg/dL (ref 8–27)
Bilirubin Total: 0.8 mg/dL (ref 0.0–1.2)
CO2: 21 mmol/L (ref 20–29)
Calcium: 10.7 mg/dL — ABNORMAL HIGH (ref 8.7–10.3)
Chloride: 107 mmol/L — ABNORMAL HIGH (ref 96–106)
Creatinine, Ser: 0.8 mg/dL (ref 0.57–1.00)
Globulin, Total: 4.3 g/dL (ref 1.5–4.5)
Glucose: 92 mg/dL (ref 65–99)
Potassium: 4.7 mmol/L (ref 3.5–5.2)
Sodium: 141 mmol/L (ref 134–144)
Total Protein: 8.6 g/dL — ABNORMAL HIGH (ref 6.0–8.5)
eGFR: 81 mL/min/{1.73_m2} (ref >59)

## 2021-03-18 LAB — PROTEIN ELECTROPHORESIS, SERUM
A/G Ratio: 0.9 (ref 0.7–1.7)
Albumin ELP: 4.1 g/dL (ref 2.9–4.4)
Alpha 1: 0.2 g/dL (ref 0.0–0.4)
Alpha 2: 0.6 g/dL (ref 0.4–1.0)
Beta: 1.1 g/dL (ref 0.7–1.3)
Gamma Globulin: 2.7 g/dL — ABNORMAL HIGH (ref 0.4–1.8)
Globulin, Total: 4.5 g/dL — ABNORMAL HIGH (ref 2.2–3.9)

## 2021-03-18 LAB — CBC
Hematocrit: 36.1 % (ref 34.0–46.6)
Hemoglobin: 11.9 g/dL (ref 11.1–15.9)
MCH: 28.3 pg (ref 26.6–33.0)
MCHC: 33 g/dL (ref 31.5–35.7)
MCV: 86 fL (ref 79–97)
Platelets: 318 10*3/uL (ref 150–450)
RBC: 4.21 x10E6/uL (ref 3.77–5.28)
RDW: 14.1 % (ref 11.7–15.4)
WBC: 6.7 10*3/uL (ref 3.4–10.8)

## 2021-03-18 LAB — LIPID PANEL
Chol/HDL Ratio: 4.5 ratio — ABNORMAL HIGH (ref 0.0–4.4)
Cholesterol, Total: 166 mg/dL (ref 100–199)
HDL: 37 mg/dL — ABNORMAL LOW (ref >39)
LDL Chol Calc (NIH): 108 mg/dL — ABNORMAL HIGH (ref 0–99)
Triglycerides: 118 mg/dL (ref 0–149)
VLDL Cholesterol Cal: 21 mg/dL (ref 5–40)

## 2021-03-18 LAB — VITAMIN D 25 HYDROXY (VIT D DEFICIENCY, FRACTURES): Vit D, 25-Hydroxy: 42.5 ng/mL (ref 30.0–100.0)

## 2021-03-18 LAB — URIC ACID: Uric Acid: 7.7 mg/dL — ABNORMAL HIGH (ref 3.0–7.2)

## 2021-03-18 LAB — PTH, INTACT AND CALCIUM: PTH: 49 pg/mL (ref 15–65)

## 2021-03-18 LAB — PHOSPHORUS: Phosphorus: 3.2 mg/dL (ref 3.0–4.3)

## 2021-04-15 ENCOUNTER — Other Ambulatory Visit: Payer: Self-pay | Admitting: Nurse Practitioner

## 2021-04-16 ENCOUNTER — Telehealth: Payer: Self-pay | Admitting: Internal Medicine

## 2021-04-16 ENCOUNTER — Encounter: Payer: Self-pay | Admitting: Nurse Practitioner

## 2021-04-16 NOTE — Telephone Encounter (Signed)
Received a new hem referral from Arnette Felts, Np for hypercalcemia. Ms. Virginia Robles has been cld and scheduled to see Dr. Arbutus Ped on 7/18 at 11:45am w/labs at 11:15am per pt's request. She's aware to arrive 15 minutes early.

## 2021-05-12 ENCOUNTER — Inpatient Hospital Stay (HOSPITAL_BASED_OUTPATIENT_CLINIC_OR_DEPARTMENT_OTHER): Payer: Medicare PPO | Admitting: Internal Medicine

## 2021-05-12 ENCOUNTER — Other Ambulatory Visit: Payer: Self-pay | Admitting: Internal Medicine

## 2021-05-12 ENCOUNTER — Other Ambulatory Visit: Payer: Self-pay

## 2021-05-12 ENCOUNTER — Inpatient Hospital Stay: Payer: Medicare PPO | Attending: Internal Medicine

## 2021-05-12 VITALS — BP 143/77 | HR 74 | Temp 98.2°F | Resp 18 | Ht 68.4 in | Wt 180.4 lb

## 2021-05-12 DIAGNOSIS — D539 Nutritional anemia, unspecified: Secondary | ICD-10-CM

## 2021-05-12 DIAGNOSIS — Z79899 Other long term (current) drug therapy: Secondary | ICD-10-CM | POA: Diagnosis not present

## 2021-05-12 DIAGNOSIS — K5190 Ulcerative colitis, unspecified, without complications: Secondary | ICD-10-CM | POA: Insufficient documentation

## 2021-05-12 DIAGNOSIS — I10 Essential (primary) hypertension: Secondary | ICD-10-CM

## 2021-05-12 LAB — CBC WITH DIFFERENTIAL (CANCER CENTER ONLY)
Abs Immature Granulocytes: 0.02 10*3/uL (ref 0.00–0.07)
Basophils Absolute: 0.1 10*3/uL (ref 0.0–0.1)
Basophils Relative: 1 %
Eosinophils Absolute: 0.4 10*3/uL (ref 0.0–0.5)
Eosinophils Relative: 6 %
HCT: 34.1 % — ABNORMAL LOW (ref 36.0–46.0)
Hemoglobin: 12.1 g/dL (ref 12.0–15.0)
Immature Granulocytes: 0 %
Lymphocytes Relative: 36 %
Lymphs Abs: 2.5 10*3/uL (ref 0.7–4.0)
MCH: 28.8 pg (ref 26.0–34.0)
MCHC: 35.5 g/dL (ref 30.0–36.0)
MCV: 81.2 fL (ref 80.0–100.0)
Monocytes Absolute: 0.6 10*3/uL (ref 0.1–1.0)
Monocytes Relative: 8 %
Neutro Abs: 3.3 10*3/uL (ref 1.7–7.7)
Neutrophils Relative %: 49 %
Platelet Count: 324 10*3/uL (ref 150–400)
RBC: 4.2 MIL/uL (ref 3.87–5.11)
RDW: 14.3 % (ref 11.5–15.5)
WBC Count: 6.9 10*3/uL (ref 4.0–10.5)
nRBC: 0 % (ref 0.0–0.2)

## 2021-05-12 LAB — CEA (IN HOUSE-CHCC): CEA (CHCC-In House): <1 ng/mL (ref 0.00–5.00)

## 2021-05-12 LAB — CMP (CANCER CENTER ONLY)
ALT: 35 U/L (ref 0–44)
AST: 39 U/L (ref 15–41)
Albumin: 3.9 g/dL (ref 3.5–5.0)
Alkaline Phosphatase: 60 U/L (ref 38–126)
Anion gap: 9 (ref 5–15)
BUN: 13 mg/dL (ref 8–23)
CO2: 23 mmol/L (ref 22–32)
Calcium: 10.8 mg/dL — ABNORMAL HIGH (ref 8.9–10.3)
Chloride: 108 mmol/L (ref 98–111)
Creatinine: 0.86 mg/dL (ref 0.44–1.00)
GFR, Estimated: >60 mL/min (ref >60)
Glucose, Bld: 85 mg/dL (ref 70–99)
Potassium: 4.2 mmol/L (ref 3.5–5.1)
Sodium: 140 mmol/L (ref 135–145)
Total Bilirubin: 0.9 mg/dL (ref 0.3–1.2)
Total Protein: 8.7 g/dL — ABNORMAL HIGH (ref 6.5–8.1)

## 2021-05-12 LAB — IRON AND TIBC
Iron: 84 ug/dL (ref 41–142)
Saturation Ratios: 29 % (ref 21–57)
TIBC: 289 ug/dL (ref 236–444)
UIBC: 205 ug/dL (ref 120–384)

## 2021-05-12 LAB — FERRITIN: Ferritin: 177 ng/mL (ref 11–307)

## 2021-05-12 LAB — LACTATE DEHYDROGENASE: LDH: 111 U/L (ref 98–192)

## 2021-05-12 LAB — VITAMIN B12: Vitamin B-12: 370 pg/mL (ref 180–914)

## 2021-05-12 LAB — FOLATE: Folate: 43 ng/mL (ref >5.9)

## 2021-05-12 NOTE — Progress Notes (Signed)
[facility] [phone]   [phone]  CONSULT NOTE  REFERRING PHYSICIAN: Marianne Sofia, FNP  REASON FOR CONSULTATION:  65 years old African-American female with abnormal protein study.  HPI Virginia Robles is a 65 y.o. female with past medical history significant for hypertension and ulcerative colitis followed by Dr. Loreta Ave.  The patient was seen by her primary care provider Ms. Moore and during her blood work she was found to have elevated serum calcium of 11.3.  The patient had extensive blood work including serum protein electrophoresis that was performed on Mar 17, 2021 and that showed no M spike but there was elevated total globulin of 4.5 as well as increased gammaglobulin of 2.7.  The serum protein electrophoresis reflects a polyclonal increase in gamma globulin and this hypergammaglobulinemia can be found and infectious and autoimmune disease.  There was no evidence of monoclonal protein.  The patient was referred to me today for evaluation and recommendation regarding her condition.  Her parathyroid panel was normal.  The patient takes multivitamins and she also eats a lot of cheese but no calcium supplements. When seen today she is feeling fine with no concerning complaints.  She denied having any current chest pain, shortness of breath, cough or hemoptysis.  She denied having any nausea, vomiting, diarrhea or constipation.  She has no headache or visual changes.  She has no weight loss or night sweats. Family history significant for mother with kidney cancer and hypertension.  Father had diabetes mellitus and sister had pancreatic cancer. The patient is married and has 1 son.  She is currently retired and used to work for [employer] at [employer].  She has a history of smoking for only 1 years but quit long time ago.  She also drinks wine once a week.  She has no history of drug abuse.  HPI  Past Medical History:  Diagnosis Date    Hypertension    Ulcerative colitis     No past surgical history on file.  Family History  Problem Relation Age of Onset   Hypertension Mother    Cancer Mother    Diabetes Father    Stroke Sister    Hypertension Sister    Hyperlipidemia Sister    Cancer Sister    Irritable bowel syndrome Sister    Breast cancer Niece 62    Social History Social History   Tobacco Use   Smoking status: Former    Types: Cigarettes    Quit date: 11/18/1972    Years since quitting: 48.5   Smokeless tobacco: Never   Tobacco comments:    only one year during college  Substance Use Topics   Alcohol use: Yes    Alcohol/week: 3.0 standard drinks    Types: 3 Glasses of wine per week   Drug use: Never    Allergies  Allergen Reactions   Elemental Sulfur Hives   Penicillins Hives    Current Outpatient Medications  Medication Sig Dispense Refill   cholecalciferol (VITAMIN D3) 25 MCG (1000 UT) tablet Take 1,000 Units by mouth daily.     Colchicine (MITIGARE) 0.6 MG CAPS Take 1 tablet by mouth daily as needed. 30 capsule 2   diclofenac sodium (VOLTAREN) 1 % GEL Apply 2 g topically 4 (four) times daily. 50 g 1   lisinopril (ZESTRIL) 40 MG tablet TAKE 1 TABLET(40 MG) BY MOUTH DAILY 90 tablet 0   mesalamine (LIALDA) 1.2 g EC tablet Take 1.2 g by mouth  daily with breakfast. Patient taking as needed     Multiple Vitamins-Minerals (MULTIVITAMIN ADULT PO) Take 1 tablet by mouth daily.     No current facility-administered medications for this visit.    Review of Systems  Constitutional: negative Eyes: negative Ears, nose, mouth, throat, and face: negative Respiratory: negative Cardiovascular: negative Gastrointestinal: negative Genitourinary:negative Integument/breast: negative Hematologic/lymphatic: negative Musculoskeletal:negative Neurological: negative Behavioral/Psych: negative Endocrine: negative Allergic/Immunologic: negative  Physical Exam  IEP:PIRJJ, healthy, no distress, well  nourished, well developed, and anxious SKIN: skin color, texture, turgor are normal, no rashes or significant lesions HEAD: Normocephalic, No masses, lesions, tenderness or abnormalities EYES: normal, PERRLA, Conjunctiva are pink and non-injected EARS: External ears normal, Canals clear OROPHARYNX:no exudate, no erythema, and lips, buccal mucosa, and tongue normal  NECK: supple, no adenopathy, no JVD LYMPH:  no palpable lymphadenopathy, no hepatosplenomegaly BREAST:not examined LUNGS: clear to auscultation , and palpation HEART: regular rate & rhythm, no murmurs, and no gallops ABDOMEN:abdomen soft, non-tender, normal bowel sounds, and no masses or organomegaly BACK: No CVA tenderness, Range of motion is normal EXTREMITIES:no joint deformities, effusion, or inflammation, no edema  NEURO: alert & oriented x 3 with fluent speech, no focal motor/sensory deficits  PERFORMANCE STATUS: ECOG 1  LABORATORY DATA: Lab Results  Component Value Date   WBC 6.9 05/12/2021   HGB 12.1 05/12/2021   HCT 34.1 (L) 05/12/2021   MCV 81.2 05/12/2021   PLT 324 05/12/2021      Chemistry      Component Value Date/Time   NA 141 03/17/2021 1034   K 4.7 03/17/2021 1034   CL 107 (H) 03/17/2021 1034   CO2 21 03/17/2021 1034   BUN 10 03/17/2021 1034   CREATININE 0.80 03/17/2021 1034      Component Value Date/Time   CALCIUM 10.7 (H) 03/17/2021 1034   ALKPHOS 61 03/17/2021 1034   AST 31 03/17/2021 1034   ALT 33 (H) 03/17/2021 1034   BILITOT 0.8 03/17/2021 1034       RADIOGRAPHIC STUDIES: No results found.  ASSESSMENT: This is a very pleasant 65 years old African-American female who was referred to me today for evaluation of polyclonal increase in the gamma globulin with hypercalcemia.   PLAN: I had a lengthy discussion with the patient today about her condition.  The abnormality seen in her her routine study is unremarkable and could be secondary to autoimmune disorder or other inflammatory  disorder in this patient with a history of ulcerative colitis. Her hypercalcemia is mild and of unclear etiology.  It could be secondary to dietary consumption and her multivitamins and also eating too much cheese as well as vitamin D supplements. I repeated several studies today for evaluation of any underlying etiology of her condition and her anemia panel as well as the comprehensive metabolic panel are unremarkable except for mild hypercalcemia. I also order serum protein electrophoresis with immunofixation to rule out any underlying monoclonal gammopathy. I assured the patient that I do not see any clear etiology at this point for her condition and she will continue with her primary care physician for monitoring of the hypercalcemia.  Her CEA is normal. I will see the patient as needed basis at this point and she was advised to call if she has any other concerning issues. The patient voices understanding of current disease status and treatment options and is in agreement with the current care plan.  All questions were answered. The patient knows to call the clinic with any problems, questions or concerns.  We can certainly see the patient much sooner if necessary.  Thank you so much for allowing me to participate in the care of [employer]. I will continue to follow up the patient with you and assist in her care. The total time spent in the appointment was 60 minutes.  Disclaimer: This note was dictated with voice recognition software. Similar sounding words can inadvertently be transcribed and may not be corrected upon review.   Lajuana Matte May 12, 2021, 12:22 PM

## 2021-05-14 LAB — PROTEIN ELECTROPHORESIS, SERUM, WITH REFLEX
A/G Ratio: 1 (ref 0.7–1.7)
Albumin ELP: 4 g/dL (ref 2.9–4.4)
Alpha-1-Globulin: 0.2 g/dL (ref 0.0–0.4)
Alpha-2-Globulin: 0.5 g/dL (ref 0.4–1.0)
Beta Globulin: 1 g/dL (ref 0.7–1.3)
Gamma Globulin: 2.4 g/dL — ABNORMAL HIGH (ref 0.4–1.8)
Globulin, Total: 4.2 g/dL — ABNORMAL HIGH (ref 2.2–3.9)
Total Protein ELP: 8.2 g/dL (ref 6.0–8.5)

## 2021-05-15 DIAGNOSIS — K5190 Ulcerative colitis, unspecified, without complications: Secondary | ICD-10-CM | POA: Diagnosis not present

## 2021-05-15 DIAGNOSIS — R194 Change in bowel habit: Secondary | ICD-10-CM | POA: Diagnosis not present

## 2021-05-19 ENCOUNTER — Ambulatory Visit: Payer: Medicare PPO | Admitting: Nurse Practitioner

## 2021-06-03 DIAGNOSIS — Z1231 Encounter for screening mammogram for malignant neoplasm of breast: Secondary | ICD-10-CM | POA: Diagnosis not present

## 2021-06-03 LAB — HM MAMMOGRAPHY

## 2021-06-09 ENCOUNTER — Encounter: Payer: Self-pay | Admitting: Nurse Practitioner

## 2021-06-12 DIAGNOSIS — H04123 Dry eye syndrome of bilateral lacrimal glands: Secondary | ICD-10-CM | POA: Diagnosis not present

## 2021-06-12 DIAGNOSIS — H43813 Vitreous degeneration, bilateral: Secondary | ICD-10-CM | POA: Diagnosis not present

## 2021-06-12 DIAGNOSIS — H5213 Myopia, bilateral: Secondary | ICD-10-CM | POA: Diagnosis not present

## 2021-06-16 ENCOUNTER — Encounter: Payer: Self-pay | Admitting: Nurse Practitioner

## 2021-06-17 ENCOUNTER — Other Ambulatory Visit: Payer: Self-pay

## 2021-06-17 DIAGNOSIS — M1A0710 Idiopathic chronic gout, right ankle and foot, without tophus (tophi): Secondary | ICD-10-CM

## 2021-06-17 MED ORDER — COLCHICINE 0.6 MG PO CAPS: 1.0000 | ORAL_CAPSULE | Freq: Every day | ORAL | 2 refills | Status: DC | PRN

## 2021-06-17 MED ORDER — LISINOPRIL 40 MG PO TABS: ORAL_TABLET | ORAL | 0 refills | Status: DC

## 2021-07-09 ENCOUNTER — Ambulatory Visit (INDEPENDENT_AMBULATORY_CARE_PROVIDER_SITE_OTHER): Payer: Medicare PPO | Admitting: Nurse Practitioner

## 2021-07-09 ENCOUNTER — Encounter: Payer: Self-pay | Admitting: Nurse Practitioner

## 2021-07-09 ENCOUNTER — Other Ambulatory Visit: Payer: Self-pay

## 2021-07-09 VITALS — BP 138/78 | HR 69 | Temp 99.0°F | Ht 68.4 in | Wt 180.0 lb

## 2021-07-09 DIAGNOSIS — Z6827 Body mass index (BMI) 27.0-27.9, adult: Secondary | ICD-10-CM | POA: Diagnosis not present

## 2021-07-09 DIAGNOSIS — E663 Overweight: Secondary | ICD-10-CM | POA: Diagnosis not present

## 2021-07-09 DIAGNOSIS — E782 Mixed hyperlipidemia: Secondary | ICD-10-CM | POA: Diagnosis not present

## 2021-07-09 DIAGNOSIS — M1A0710 Idiopathic chronic gout, right ankle and foot, without tophus (tophi): Secondary | ICD-10-CM

## 2021-07-09 DIAGNOSIS — I10 Essential (primary) hypertension: Secondary | ICD-10-CM | POA: Diagnosis not present

## 2021-07-09 DIAGNOSIS — Z23 Encounter for immunization: Secondary | ICD-10-CM | POA: Diagnosis not present

## 2021-07-09 DIAGNOSIS — Z0000 Encounter for general adult medical examination without abnormal findings: Secondary | ICD-10-CM

## 2021-07-09 MED ORDER — ZOSTER VAC RECOMB ADJUVANTED 50 MCG/0.5ML IM SUSR: 0.5000 mL | Freq: Once | INTRAMUSCULAR | 1 refills | Status: AC

## 2021-07-09 NOTE — Patient Instructions (Signed)
Health Maintenance, Female Adopting a healthy lifestyle and getting preventive care are important in promoting health and wellness. Ask your health care provider about: The right schedule for you to have regular tests and exams. Things you can do on your own to prevent diseases and keep yourself healthy. What should I know about diet, weight, and exercise? Eat a healthy diet  Eat a diet that includes plenty of vegetables, fruits, low-fat dairy products, and lean protein. Do not eat a lot of foods that are high in solid fats, added sugars, or sodium. Maintain a healthy weight Body mass index (BMI) is used to identify weight problems. It estimates body fat based on height and weight. Your health care provider can help determine your BMI and help you achieve or maintain a healthy weight. Get regular exercise Get regular exercise. This is one of the most important things you can do for your health. Most adults should: Exercise for at least 150 minutes each week. The exercise should increase your heart rate and make you sweat (moderate-intensity exercise). Do strengthening exercises at least twice a week. This is in addition to the moderate-intensity exercise. Spend less time sitting. Even light physical activity can be beneficial. Watch cholesterol and blood lipids Have your blood tested for lipids and cholesterol at 66 years of age, then have this test every 5 years. Have your cholesterol levels checked more often if: Your lipid or cholesterol levels are high. You are older than 66 years of age. You are at high risk for heart disease. What should I know about cancer screening? Depending on your health history and family history, you may need to have cancer screening at various ages. This may include screening for: Breast cancer. Cervical cancer. Colorectal cancer. Skin cancer. Lung cancer. What should I know about heart disease, diabetes, and high blood pressure? Blood pressure and heart  disease High blood pressure causes heart disease and increases the risk of stroke. This is more likely to develop in people who have high blood pressure readings, are of African descent, or are overweight. Have your blood pressure checked: Every 3-5 years if you are 18-39 years of age. Every year if you are 40 years old or older. Diabetes Have regular diabetes screenings. This checks your fasting blood sugar level. Have the screening done: Once every three years after age 40 if you are at a normal weight and have a low risk for diabetes. More often and at a younger age if you are overweight or have a high risk for diabetes. What should I know about preventing infection? Hepatitis B If you have a higher risk for hepatitis B, you should be screened for this virus. Talk with your health care provider to find out if you are at risk for hepatitis B infection. Hepatitis C Testing is recommended for: Everyone born from 1945 through 1965. Anyone with known risk factors for hepatitis C. Sexually transmitted infections (STIs) Get screened for STIs, including gonorrhea and chlamydia, if: You are sexually active and are younger than 66 years of age. You are older than 66 years of age and your health care provider tells you that you are at risk for this type of infection. Your sexual activity has changed since you were last screened, and you are at increased risk for chlamydia or gonorrhea. Ask your health care provider if you are at risk. Ask your health care provider about whether you are at high risk for HIV. Your health care provider may recommend a prescription medicine   to help prevent HIV infection. If you choose to take medicine to prevent HIV, you should first get tested for HIV. You should then be tested every 3 months for as long as you are taking the medicine. Pregnancy If you are about to stop having your period (premenopausal) and you may become pregnant, seek counseling before you get  pregnant. Take 400 to 800 micrograms (mcg) of folic acid every day if you become pregnant. Ask for birth control (contraception) if you want to prevent pregnancy. Osteoporosis and menopause Osteoporosis is a disease in which the bones lose minerals and strength with aging. This can result in bone fractures. If you are 65 years old or older, or if you are at risk for osteoporosis and fractures, ask your health care provider if you should: Be screened for bone loss. Take a calcium or vitamin D supplement to lower your risk of fractures. Be given hormone replacement therapy (HRT) to treat symptoms of menopause. Follow these instructions at home: Lifestyle Do not use any products that contain nicotine or tobacco, such as cigarettes, e-cigarettes, and chewing tobacco. If you need help quitting, ask your health care provider. Do not use street drugs. Do not share needles. Ask your health care provider for help if you need support or information about quitting drugs. Alcohol use Do not drink alcohol if: Your health care provider tells you not to drink. You are pregnant, may be pregnant, or are planning to become pregnant. If you drink alcohol: Limit how much you use to 0-1 drink a day. Limit intake if you are breastfeeding. Be aware of how much alcohol is in your drink. In the [place] one drink equals one 12 oz bottle of beer (355 mL), one 5 oz glass of wine (148 mL), or one 1 oz glass of hard liquor (44 mL). General instructions Schedule regular health, dental, and eye exams. Stay current with your vaccines. Tell your health care provider if: You often feel depressed. You have ever been abused or do not feel safe at home. Summary Adopting a healthy lifestyle and getting preventive care are important in promoting health and wellness. Follow your health care provider's instructions about healthy diet, exercising, and getting tested or screened for diseases. Follow your health care provider's  instructions on monitoring your cholesterol and blood pressure. This information is not intended to replace advice given to you by your health care provider. Make sure you discuss any questions you have with your health care provider. Document Revised: 12/20/2020 Document Reviewed: 10/05/2018 Elsevier Patient Education  2022 Elsevier Inc.  

## 2021-07-09 NOTE — Progress Notes (Signed)
I,Tianna Badgett,acting as a Education administrator for [employer], FNP.,have documented all relevant documentation on the behalf of Minette Brine, FNP,as directed by  Minette Brine, FNP while in the presence of Minette Brine, [place].  This visit occurred during the SARS-CoV-2 public health emergency.  Safety protocols were in place, including screening questions prior to the visit, additional usage of staff PPE, and extensive cleaning of exam room while observing appropriate contact time as indicated for disinfecting solutions.  Subjective:     Patient ID: Virginia Robles , female    DOB: [birth year] , 65 y.o.   MRN: [id number]   Chief Complaint  Patient presents with   Annual Exam    HPI  Patient is here for hm  Hypertension This is a chronic problem. The current episode started more than 1 year ago. The problem is unchanged. The problem is controlled. Pertinent negatives include no anxiety, chest pain, headaches, malaise/fatigue or palpitations. There are no associated agents to hypertension. There are no known risk factors for coronary artery disease. Past treatments include ACE inhibitors. The current treatment provides moderate improvement. There are no compliance problems.  There is no history of angina, kidney disease or heart failure. There is no history of chronic renal disease.  Hyperlipidemia This is a chronic problem. The current episode started more than 1 year ago. The problem is controlled. Recent lipid tests were reviewed and are high. She has no history of chronic renal disease. There are no known factors aggravating her hyperlipidemia. Pertinent negatives include no chest pain. There are no compliance problems.  There are no known risk factors for coronary artery disease.    Past Medical History:  Diagnosis Date   Hypertension    Ulcerative colitis      Family History  Problem Relation Age of Onset   Hypertension Mother    Cancer Mother    Diabetes Father    Stroke Sister     Hypertension Sister    Hyperlipidemia Sister    Cancer Sister    Irritable bowel syndrome Sister    Breast cancer Niece 58     Current Outpatient Medications:    Colchicine (MITIGARE) 0.6 MG CAPS, Take 1 tablet by mouth daily as needed., Disp: 30 capsule, Rfl: 2   diclofenac sodium (VOLTAREN) 1 % GEL, Apply 2 g topically 4 (four) times daily., Disp: 50 g, Rfl: 1   lisinopril (ZESTRIL) 40 MG tablet, Take one tablet by mouth daily., Disp: 90 tablet, Rfl: 0   mesalamine (LIALDA) 1.2 g EC tablet, Take 1.2 g by mouth daily with breakfast. Patient taking as needed, Disp: , Rfl:    Multiple Vitamins-Minerals (MULTIVITAMIN ADULT PO), Take 1 tablet by mouth daily., Disp: , Rfl:    Allergies  Allergen Reactions   Elemental Sulfur Hives   Penicillins Hives      The patient states she is status post hysterectomy.   No LMP recorded. Patient has had a hysterectomy.. Negative for Dysmenorrhea and Negative for Menorrhagia. Negative for: breast discharge, breast lump(s), breast pain and breast self exam. Associated symptoms include abnormal vaginal bleeding. Pertinent negatives include abnormal bleeding (hematology), anxiety, decreased libido, depression, difficulty falling sleep, dyspareunia, history of infertility, nocturia, sexual dysfunction, sleep disturbances, urinary incontinence, urinary urgency, vaginal discharge and vaginal itching. Diet regular; does not have a big appetite. The patient states her exercise level is moderate - she goes to the [employer].    The patient's tobacco use is:  Social History   Tobacco Use  Smoking Status Former   Types: Cigarettes   Quit date: 11/18/1972   Years since quitting: 48.7  Smokeless Tobacco Never  Tobacco Comments   only one year during college   She has been exposed to passive smoke. The patient's alcohol use is:  Social History   Substance and Sexual Activity  Alcohol Use Yes   Alcohol/week: 3.0 standard drinks   Types: 3 Glasses of  wine per week     Review of Systems  Constitutional: Negative.  Negative for malaise/fatigue.  HENT: Negative.    Eyes: Negative.   Respiratory: Negative.    Cardiovascular: Negative.  Negative for chest pain, palpitations and leg swelling.  Gastrointestinal: Negative.   Endocrine: Negative.   Genitourinary: Negative.   Musculoskeletal: Negative.   Skin: Negative.   Allergic/Immunologic: Negative.   Neurological: Negative.  Negative for headaches.  Hematological: Negative.   Psychiatric/Behavioral: Negative.      Today's Vitals   07/09/21 1120  BP: 138/78  Pulse: 69  Temp: 99 F (37.2 C)  TempSrc: Oral  Weight: 180 lb (81.6 kg)  Height: 5' 8.4" (1.737 m)   Body mass index is 27.05 kg/m.  Wt Readings from Last 3 Encounters:  07/09/21 180 lb (81.6 kg)  05/12/21 180 lb 6.4 oz (81.8 kg)  03/17/21 181 lb 9.6 oz (82.4 kg)    Objective:  Physical Exam Vitals reviewed.  Constitutional:      General: She is not in acute distress.    Appearance: Normal appearance. She is well-developed. She is obese.  HENT:     Head: Normocephalic and atraumatic.     Right Ear: Hearing, tympanic membrane, ear canal and external ear normal. There is no impacted cerumen.     Left Ear: Hearing, tympanic membrane, ear canal and external ear normal. There is no impacted cerumen.     Nose:     Comments: Deferred - masked    Mouth/Throat:     Comments: Deferred - masked Eyes:     General: Lids are normal.     Extraocular Movements: Extraocular movements intact.     Conjunctiva/sclera: Conjunctivae normal.     Pupils: Pupils are equal, round, and reactive to light.     Funduscopic exam:    Right eye: No papilledema.        Left eye: No papilledema.  Neck:     Thyroid: No thyroid mass.     Vascular: No carotid bruit.  Cardiovascular:     Rate and Rhythm: Normal rate and regular rhythm.     Pulses: Normal pulses.     Heart sounds: Normal heart sounds. No murmur heard. Pulmonary:      Effort: Pulmonary effort is normal. No respiratory distress.     Breath sounds: Normal breath sounds. No wheezing.  Chest:     Chest wall: No mass.  Breasts:    Tanner Score is 5.     Right: Normal. No mass or tenderness.     Left: Normal. No mass or tenderness.  Abdominal:     General: Abdomen is flat. Bowel sounds are normal. There is no distension.     Palpations: Abdomen is soft.     Tenderness: There is no abdominal tenderness.  Musculoskeletal:        General: No swelling or tenderness. Normal range of motion.     Cervical back: Full passive range of motion without pain, normal range of motion and neck supple.     Right lower leg: No edema.  Left lower leg: No edema.  Lymphadenopathy:     Upper Body:     Right upper body: No supraclavicular, axillary or pectoral adenopathy.     Left upper body: No supraclavicular, axillary or pectoral adenopathy.  Skin:    General: Skin is warm and dry.     Capillary Refill: Capillary refill takes less than 2 seconds.  Neurological:     General: No focal deficit present.     Mental Status: She is alert and oriented to person, place, and time.     Cranial Nerves: No cranial nerve deficit.     Sensory: No sensory deficit.  Psychiatric:        Mood and Affect: Mood normal.        Behavior: Behavior normal.        Thought Content: Thought content normal.        Judgment: Judgment normal.        Assessment And Plan:     1. Encounter for annual physical exam - CBC  2. Essential hypertension Comments: Chronic fair control Continue current medications - CMP14+EGFR  3. Mixed hyperlipidemia Comments: Stable, no current medications Low fat diet and high fiber diet - CMP14+EGFR - Lipid panel  4. Chronic idiopathic gout involving toe of right foot without tophus She is doing well, no recent exacerbations - Uric acid  5. Encounter for immunization Influenza vaccine administered Encouraged to take Tylenol as needed for fever or  muscle aches. Rx sent to pharmacy for Shingrix.  - Flu Vaccine QUAD High Dose(Fluad) - Zoster Vaccine Adjuvanted [facility]) injection; Inject 0.5 mLs into the muscle once for 1 dose. Administer 2nd dose in 2-6 months  Please fax when each dose administered [phone]  Dispense: 0.5 mL; Refill: 1  6. Overweight with body mass index (BMI) of 27 to 27.9 in adult - Hemoglobin A1c She is encouraged to strive for BMI less than 27 to decrease cardiac risk. Advised to aim for at least 150 minutes of exercise per week.     Patient was given opportunity to ask questions. Patient verbalized understanding of the plan and was able to repeat key elements of the plan. All questions were answered to their satisfaction.   Minette Brine, FNP   I, Minette Brine, FNP, have reviewed all documentation for this visit. The documentation on 07/25/21 for the exam, diagnosis, procedures, and orders are all accurate and complete.  THE PATIENT IS ENCOURAGED TO PRACTICE SOCIAL DISTANCING DUE TO THE COVID-19 PANDEMIC.

## 2021-07-30 DIAGNOSIS — Z882 Allergy status to sulfonamides status: Secondary | ICD-10-CM | POA: Diagnosis not present

## 2021-07-30 DIAGNOSIS — K5190 Ulcerative colitis, unspecified, without complications: Secondary | ICD-10-CM | POA: Diagnosis not present

## 2021-07-30 DIAGNOSIS — Z8249 Family history of ischemic heart disease and other diseases of the circulatory system: Secondary | ICD-10-CM | POA: Diagnosis not present

## 2021-07-30 DIAGNOSIS — Z880 Allergy status to penicillin: Secondary | ICD-10-CM | POA: Diagnosis not present

## 2021-07-30 DIAGNOSIS — Z833 Family history of diabetes mellitus: Secondary | ICD-10-CM | POA: Diagnosis not present

## 2021-07-30 DIAGNOSIS — I10 Essential (primary) hypertension: Secondary | ICD-10-CM | POA: Diagnosis not present

## 2021-07-30 DIAGNOSIS — Z1211 Encounter for screening for malignant neoplasm of colon: Secondary | ICD-10-CM | POA: Diagnosis not present

## 2021-07-30 DIAGNOSIS — Z811 Family history of alcohol abuse and dependence: Secondary | ICD-10-CM | POA: Diagnosis not present

## 2021-07-30 DIAGNOSIS — R194 Change in bowel habit: Secondary | ICD-10-CM | POA: Diagnosis not present

## 2021-07-30 DIAGNOSIS — K6389 Other specified diseases of intestine: Secondary | ICD-10-CM | POA: Diagnosis not present

## 2021-07-30 DIAGNOSIS — K5140 Inflammatory polyps of colon without complications: Secondary | ICD-10-CM | POA: Diagnosis not present

## 2021-07-30 DIAGNOSIS — Z87891 Personal history of nicotine dependence: Secondary | ICD-10-CM | POA: Diagnosis not present

## 2021-07-30 LAB — HM COLONOSCOPY

## 2021-07-31 ENCOUNTER — Encounter: Payer: Self-pay | Admitting: Nurse Practitioner

## 2021-08-04 ENCOUNTER — Encounter: Payer: Self-pay | Admitting: Nurse Practitioner

## 2021-08-06 ENCOUNTER — Other Ambulatory Visit: Payer: Self-pay | Admitting: Nurse Practitioner

## 2021-08-15 ENCOUNTER — Encounter: Payer: Self-pay | Admitting: Nurse Practitioner

## 2021-08-19 ENCOUNTER — Other Ambulatory Visit: Payer: Self-pay

## 2021-08-19 ENCOUNTER — Encounter: Payer: Self-pay | Admitting: Nurse Practitioner

## 2021-08-19 ENCOUNTER — Ambulatory Visit: Payer: Medicare PPO | Admitting: Nurse Practitioner

## 2021-08-19 VITALS — BP 132/80 | HR 79 | Temp 98.1°F | Ht 68.0 in | Wt 179.2 lb

## 2021-08-19 DIAGNOSIS — M545 Low back pain, unspecified: Secondary | ICD-10-CM

## 2021-08-19 DIAGNOSIS — M62830 Muscle spasm of back: Secondary | ICD-10-CM

## 2021-08-19 MED ORDER — CYCLOBENZAPRINE HCL 10 MG PO TABS: 10.0000 mg | ORAL_TABLET | Freq: Three times a day (TID) | ORAL | 0 refills | Status: DC | PRN

## 2021-08-19 NOTE — Patient Instructions (Addendum)
Flank Pain, Adult Flank pain is pain in your side. The flank is the area on your side between your upper belly (abdomen) and your spine. The pain may occur over a short time (acute), or it may be long-term or come back often (chronic). It may be mild or very bad. Pain in this area can be caused by many different things. Follow these instructions at home:  Drink enough fluid to keep your pee (urine) pale yellow. Rest as told by your doctor. Take over-the-counter and prescription medicines only as told by your doctor. Keep a journal to keep track of: What has caused your flank pain. What has made your flank pain feel better. Keep all follow-up visits. Contact a doctor if: Medicine does not help your pain. You have new symptoms. Your pain gets worse. Your symptoms last longer than 2-3 days. You have trouble peeing. You are peeing more often than normal. Get help right away if: You have trouble breathing. You are short of breath. Your belly hurts, or it is swollen or red. You feel like you may vomit (nauseous). You vomit. You feel faint, or you faint. You have blood in your pee. You have flank pain and a fever. These symptoms may be an emergency. Get help right away. Call your local emergency services (911 in the [place] Do not wait to see if the symptoms will go away. Do not drive yourself to the hospital. Summary Flank pain is pain in your side. The flank is the area of your side between your upper belly (abdomen) and your spine. Flank pain may occur over a short time (acute), or it may be long-term or come back often (chronic). It may be mild or very bad. Pain in this area can be caused by many different things. Contact your doctor if your symptoms get worse or last longer than 2-3 days. This information is not intended to replace advice given to you by your health care provider. Make sure you discuss any questions you have with your health care provider. Document Revised:  12/23/2020 Document Reviewed: 12/23/2020 Elsevier Patient Education  2022 Elsevier Inc.   Sciatica Rehab Ask your health care provider which exercises are safe for you. Do exercises exactly as told by your health care provider and adjust them as directed. It is normal to feel mild stretching, pulling, tightness, or discomfort as you do these exercises. Stop right away if you feel sudden pain or your pain gets worse. Do not begin these exercises until told by your health care provider. Stretching and range-of-motion exercises These exercises warm up your muscles and joints and improve the movement and flexibility of your hips and back. These exercises also help to relieve pain, numbness, and tingling. Sciatic nerve glide Sit in a chair with your head facing down toward your chest. Place your hands behind your back. Let your shoulders slump forward. Slowly straighten one of your legs while you tilt your head back as if you are looking toward the ceiling. Only straighten your leg as far as you can without making your symptoms worse. Hold this position for __________ seconds. Slowly return to the starting position. Repeat with your other leg. Repeat __________ times. Complete this exercise __________ times a day. Knee to chest with hip adduction and internal rotation  Lie on your back on a firm surface with both legs straight. Bend one of your knees and move it up toward your chest until you feel a gentle stretch in your lower back and buttock.  Then, move your knee toward the shoulder that is on the opposite side from your leg. This is hip adduction and internal rotation. Hold your leg in this position by holding on to the front of your knee. Hold this position for __________ seconds. Slowly return to the starting position. Repeat with your other leg. Repeat __________ times. Complete this exercise __________ times a day. Prone extension on elbows  Lie on your abdomen on a firm surface. A bed  may be too soft for this exercise. Prop yourself up on your elbows. Use your arms to help lift your chest up until you feel a gentle stretch in your abdomen and your lower back. This will place some of your body weight on your elbows. If this is uncomfortable, try stacking pillows under your chest. Your hips should stay down, against the surface that you are lying on. Keep your hip and back muscles relaxed. Hold this position for __________ seconds. Slowly relax your upper body and return to the starting position. Repeat __________ times. Complete this exercise __________ times a day. Strengthening exercises These exercises build strength and endurance in your back. Endurance is the ability to use your muscles for a long time, even after they get tired. Pelvic tilt This exercise strengthens the muscles that lie deep in the abdomen. Lie on your back on a firm surface. Bend your knees and keep your feet flat on the floor. Tense your abdominal muscles. Tip your pelvis up toward the ceiling and flatten your lower back into the floor. To help with this exercise, you may place a small towel under your lower back and try to push your back into the towel. Hold this position for __________ seconds. Let your muscles relax completely before you repeat this exercise. Repeat __________ times. Complete this exercise __________ times a day. Alternating arm and leg raises  Get on your hands and knees on a firm surface. If you are on a hard floor, you may want to use padding, such as an exercise mat, to cushion your knees. Line up your arms and legs. Your hands should be directly below your shoulders, and your knees should be directly below your hips. Lift your left leg behind you. At the same time, raise your right arm and straighten it in front of you. Do not lift your leg higher than your hip. Do not lift your arm higher than your shoulder. Keep your abdominal and back muscles tight. Keep your hips  facing the ground. Do not arch your back. Keep your balance carefully, and do not hold your breath. Hold this position for __________ seconds. Slowly return to the starting position. Repeat with your right leg and your left arm. Repeat __________ times. Complete this exercise __________ times a day. Posture and body mechanics Good posture and healthy body mechanics can help to relieve stress in your body's tissues and joints. Body mechanics refers to the movements and positions of your body while you do your daily activities. Posture is part of body mechanics. Good posture means: Your spine is in its natural S-curve position (neutral). Your shoulders are pulled back slightly. Your head is not tipped forward. Follow these guidelines to improve your posture and body mechanics in your everyday activities. Standing  When standing, keep your spine neutral and your feet about hip width apart. Keep a slight bend in your knees. Your ears, shoulders, and hips should line up. When you do a task in which you stand in one place for a long  time, place one foot up on a stable object that is 2-4 inches (5-10 cm) high, such as a footstool. This helps keep your spine neutral. Sitting  When sitting, keep your spine neutral and keep your feet flat on the floor. Use a footrest, if necessary, and keep your thighs parallel to the floor. Avoid rounding your shoulders, and avoid tilting your head forward. When working at a desk or a computer, keep your desk at a height where your hands are slightly lower than your elbows. Slide your chair under your desk so you are close enough to maintain good posture. When working at a computer, place your monitor at a height where you are looking straight ahead and you do not have to tilt your head forward or downward to look at the screen. Resting When lying down and resting, avoid positions that are most painful for you. If you have pain with activities such as sitting,  bending, stooping, or squatting, lie in a position in which your body does not bend very much. For example, avoid curling up on your side with your arms and knees near your chest (fetal position). If you have pain with activities such as standing for a long time or reaching with your arms, lie with your spine in a neutral position and bend your knees slightly. Try the following positions: Lying on your side with a pillow between your knees. Lying on your back with a pillow under your knees. Lifting  When lifting objects, keep your feet at least shoulder width apart and tighten your abdominal muscles. Bend your knees and hips and keep your spine neutral. It is important to lift using the strength of your legs, not your back. Do not lock your knees straight out. Always ask for help to lift heavy or awkward objects. This information is not intended to replace advice given to you by your health care provider. Make sure you discuss any questions you have with your health care provider. Document Revised: 02/03/2019 Document Reviewed: 11/03/2018 Elsevier Patient Education  2022 [employer].

## 2021-08-19 NOTE — Progress Notes (Signed)
I,Perrie Ragin,acting as a Neurosurgeon for Arnette Felts, FNP.,have documented all relevant documentation on the behalf of Arnette Felts, FNP,as directed by  Arnette Felts, FNP while in the presence of Arnette Felts, FNP.  This visit occurred during the SARS-CoV-2 public health emergency.  Safety protocols were in place, including screening questions prior to the visit, additional usage of staff PPE, and extensive cleaning of exam room while observing appropriate contact time as indicated for disinfecting solutions.  Subjective:     Patient ID: Virginia Robles , female    DOB: 10/30/1955 , 65 y.o.   MRN: [id number]   Chief Complaint  Patient presents with   Back Pain    HPI  Pt here for back pain. She thinks she has sprained her back, she has had back spasms, 2 weeks ago she was changing her bed set, hurt her back. She takes tylenol extra strength, 3 as needed. She also uses ointment that help with the pain to rub on her back. Pt states it is doing better.   Back Pain This is a new problem. The current episode started 1 to 4 weeks ago (2 weeks ago). The quality of the pain is described as cramping. The pain does not radiate. The pain is at a severity of 2/10 (The highest pain level was 10). Pertinent negatives include no abdominal pain. She has tried analgesics and heat for the symptoms.    Past Medical History:  Diagnosis Date   Hypertension    Ulcerative colitis      Family History  Problem Relation Age of Onset   Hypertension Mother    Cancer Mother    Diabetes Father    Stroke Sister    Hypertension Sister    Hyperlipidemia Sister    Cancer Sister    Irritable bowel syndrome Sister    Breast cancer Niece 66     Current Outpatient Medications:    Colchicine (MITIGARE) 0.6 MG CAPS, Take 1 tablet by mouth daily as needed., Disp: 30 capsule, Rfl: 2   cyclobenzaprine (FLEXERIL) 10 MG tablet, Take 1 tablet (10 mg total) by mouth 3 (three) times daily as needed for muscle spasms.,  Disp: 30 tablet, Rfl: 0   diclofenac sodium (VOLTAREN) 1 % GEL, Apply 2 g topically 4 (four) times daily., Disp: 50 g, Rfl: 1   lisinopril (ZESTRIL) 40 MG tablet, TAKE 1 TABLET BY MOUTH DAILY, Disp: 90 tablet, Rfl: 0   mesalamine (LIALDA) 1.2 g EC tablet, Take 1.2 g by mouth daily with breakfast. Patient taking as needed, Disp: , Rfl:    Multiple Vitamins-Minerals (MULTIVITAMIN ADULT PO), Take 1 tablet by mouth daily., Disp: , Rfl:    Allergies  Allergen Reactions   Elemental Sulfur Hives   Penicillins Hives     Review of Systems  Constitutional: Negative.   Respiratory: Negative.    Cardiovascular: Negative.   Gastrointestinal:  Negative for abdominal pain.  Musculoskeletal:  Positive for back pain.  Neurological: Negative.   Psychiatric/Behavioral: Negative.      Today's Vitals   08/19/21 1044  BP: 132/80  Pulse: 79  Temp: 98.1 F (36.7 C)  Weight: 179 lb 3.2 oz (81.3 kg)  Height: 5\' 8"  (1.727 m)  PainSc: 0-No pain   Body mass index is 27.25 kg/m.  Wt Readings from Last 3 Encounters:  08/19/21 179 lb 3.2 oz (81.3 kg)  07/09/21 180 lb (81.6 kg)  05/12/21 180 lb 6.4 oz (81.8 kg)    Objective:  Physical Exam Vitals reviewed.  Constitutional:      General: She is not in acute distress.    Appearance: Normal appearance.  Pulmonary:     Effort: Pulmonary effort is normal. No respiratory distress.     Breath sounds: No wheezing.  Musculoskeletal:        General: Tenderness (muscle tenderness to bilateral lower back areas) present. No swelling. Normal range of motion.     Comments: Negative straight leg raise  Skin:    General: Skin is warm and dry.  Neurological:     General: No focal deficit present.     Mental Status: She is alert and oriented to person, place, and time.     Cranial Nerves: No cranial nerve deficit.     Motor: No weakness.  Psychiatric:        Mood and Affect: Mood normal.        Behavior: Behavior normal.        Thought Content: Thought  content normal.        Judgment: Judgment normal.        Assessment And Plan:     1. Low back pain without sciatica, unspecified back pain laterality, unspecified chronicity Comments: Muscle strain vs muscle spasms. Improving with over the counter pain medication/cream will  Encouraged to stretch regularly  2. Muscle spasm of back Comments: Improving, tenderness on palpation.  Flexeril as needed advised to not drive or operate heavy machinery    Patient was given opportunity to ask questions. Patient verbalized understanding of the plan and was able to repeat key elements of the plan. All questions were answered to their satisfaction.  Arnette Felts, FNP   I, Arnette Felts, FNP, have reviewed all documentation for this visit. The documentation on 08/19/21 for the exam, diagnosis, procedures, and orders are all accurate and complete.   IF YOU HAVE BEEN REFERRED TO A SPECIALIST, IT MAY TAKE 1-2 WEEKS TO SCHEDULE/PROCESS THE REFERRAL. IF YOU HAVE NOT HEARD FROM US/SPECIALIST IN TWO WEEKS, PLEASE GIVE [place] A CALL AT [phone].   THE PATIENT IS ENCOURAGED TO PRACTICE SOCIAL DISTANCING DUE TO THE COVID-19 PANDEMIC.

## 2021-09-08 ENCOUNTER — Other Ambulatory Visit: Payer: Self-pay | Admitting: Nurse Practitioner

## 2021-09-08 DIAGNOSIS — M545 Low back pain, unspecified: Secondary | ICD-10-CM

## 2021-09-08 DIAGNOSIS — M62830 Muscle spasm of back: Secondary | ICD-10-CM

## 2021-11-06 DIAGNOSIS — Z87891 Personal history of nicotine dependence: Secondary | ICD-10-CM | POA: Diagnosis not present

## 2021-11-06 DIAGNOSIS — Z6826 Body mass index (BMI) 26.0-26.9, adult: Secondary | ICD-10-CM | POA: Diagnosis not present

## 2021-11-06 DIAGNOSIS — M109 Gout, unspecified: Secondary | ICD-10-CM | POA: Diagnosis not present

## 2021-11-06 DIAGNOSIS — K5190 Ulcerative colitis, unspecified, without complications: Secondary | ICD-10-CM | POA: Diagnosis not present

## 2021-11-06 DIAGNOSIS — Z833 Family history of diabetes mellitus: Secondary | ICD-10-CM | POA: Diagnosis not present

## 2021-11-06 DIAGNOSIS — Z8249 Family history of ischemic heart disease and other diseases of the circulatory system: Secondary | ICD-10-CM | POA: Diagnosis not present

## 2021-11-06 DIAGNOSIS — Z809 Family history of malignant neoplasm, unspecified: Secondary | ICD-10-CM | POA: Diagnosis not present

## 2021-11-06 DIAGNOSIS — E663 Overweight: Secondary | ICD-10-CM | POA: Diagnosis not present

## 2021-11-06 DIAGNOSIS — I10 Essential (primary) hypertension: Secondary | ICD-10-CM | POA: Diagnosis not present

## 2021-11-07 ENCOUNTER — Other Ambulatory Visit: Payer: Self-pay | Admitting: Nurse Practitioner

## 2021-11-19 ENCOUNTER — Ambulatory Visit (INDEPENDENT_AMBULATORY_CARE_PROVIDER_SITE_OTHER): Payer: Medicare PPO

## 2021-11-19 ENCOUNTER — Other Ambulatory Visit: Payer: Self-pay

## 2021-11-19 VITALS — BP 116/64 | HR 45 | Temp 98.2°F | Ht 67.8 in | Wt 177.8 lb

## 2021-11-19 DIAGNOSIS — Z0000 Encounter for general adult medical examination without abnormal findings: Secondary | ICD-10-CM | POA: Diagnosis not present

## 2021-11-19 NOTE — Progress Notes (Signed)
Subjective:   Virginia Robles is a 66 y.o. female who presents for Medicare Annual (Subsequent) preventive examination.  Review of Systems     Cardiac Risk Factors include: advanced age (>50men, >45 women);hypertension     Objective:    Today's Vitals   11/19/21 0854  BP: 116/64  Pulse: (!) 45  Temp: 98.2 F (36.8 C)  TempSrc: Oral  SpO2: 98%  Weight: 177 lb 12.8 oz (80.6 kg)  Height: 5' 7.8" (1.722 m)   Body mass index is 27.19 kg/m.  Advanced Directives 11/19/2021 11/18/2020  Does Patient Have a Medical Advance Directive? Yes Yes;No  Type of Advance Directive Living will Living will    Current Medications (verified) Outpatient Encounter Medications as of 11/19/2021  Medication Sig   Colchicine (MITIGARE) 0.6 MG CAPS Take 1 tablet by mouth daily as needed.   cyclobenzaprine (FLEXERIL) 10 MG tablet Take 1 tablet (10 mg total) by mouth 3 (three) times daily as needed for muscle spasms.   diclofenac sodium (VOLTAREN) 1 % GEL Apply 2 g topically 4 (four) times daily.   lisinopril (ZESTRIL) 40 MG tablet TAKE 1 TABLET EVERY DAY   mesalamine (LIALDA) 1.2 g EC tablet Take 1.2 g by mouth daily with breakfast. Patient taking as needed   Multiple Vitamins-Minerals (MULTIVITAMIN ADULT PO) Take 1 tablet by mouth daily.   No facility-administered encounter medications on file as of 11/19/2021.    Allergies (verified) Elemental sulfur and Penicillins   History: Past Medical History:  Diagnosis Date   Hypertension    Ulcerative colitis    History reviewed. No pertinent surgical history. Family History  Problem Relation Age of Onset   Hypertension Mother    Cancer Mother    Diabetes Father    Stroke Sister    Hypertension Sister    Hyperlipidemia Sister    Cancer Sister    Irritable bowel syndrome Sister    Breast cancer Niece 70   Social History   Socioeconomic History   Marital status: Married    Spouse name: Not on file   Number of children: Not on file    Years of education: Not on file   Highest education level: Not on file  Occupational History   Not on file  Tobacco Use   Smoking status: Former    Types: Cigarettes    Quit date: 11/18/1972    Years since quitting: 49.0   Smokeless tobacco: Never   Tobacco comments:    only one year during college  Vaping Use   Vaping Use: Never used  Substance and Sexual Activity   Alcohol use: Yes    Alcohol/week: 3.0 standard drinks    Types: 3 Glasses of wine per week   Drug use: Never   Sexual activity: Yes  Other Topics Concern   Not on file  Social History Narrative   Not on file   Social Determinants of Health   Financial Resource Strain: Low Risk    Difficulty of Paying Living Expenses: Not hard at all  Food Insecurity: No Food Insecurity   Worried About Charity fundraiser in the Last Year: Never true   [place] in the Last Year: Never true  Transportation Needs: No Transportation Needs   Lack of Transportation (Medical): No   Lack of Transportation (Non-Medical): No  Physical Activity: Sufficiently Active   Days of Exercise per Week: 3 days   Minutes of Exercise per Session: 50 min  Stress: No Stress Concern Present  Feeling of Stress : Not at all  Social Connections: Socially Integrated   Frequency of Communication with Friends and Family: More than three times a week   Frequency of Social Gatherings with Friends and Family: Once a week   Attends Religious Services: More than 4 times per year   Active Member of [employer] or Organizations: Yes   Attends Music therapist: More than 4 times per year   Marital Status: Married    Tobacco Counseling Counseling given: Not Answered Tobacco comments: only one year during college   Clinical Intake:  Pre-visit preparation completed: Yes  Pain : No/denies pain     Nutritional Status: BMI 25 -29 Overweight Nutritional Risks: None Diabetes: No  How often do you need to have someone help you when you  read instructions, pamphlets, or other written materials from your doctor or pharmacy?: 1 - Never What is the last grade level you completed in school?: masters degree  Diabetic? no  Interpreter Needed?: No  Information entered by :: NAllen LPN   Activities of Daily Living In your present state of health, do you have any difficulty performing the following activities: 11/19/2021 12/16/2020  Hearing? N N  Vision? N N  Difficulty concentrating or making decisions? N N  Walking or climbing stairs? N N  Dressing or bathing? N N  Doing errands, shopping? N N  Preparing Food and eating ? N -  Using the Toilet? N -  In the past six months, have you accidently leaked urine? N -  Do you have problems with loss of bowel control? N -  Managing your Medications? N -  Managing your Finances? N -  Housekeeping or managing your Housekeeping? N -  Some recent data might be hidden    Patient Care Team: Minette Brine, FNP as PCP - General (General Practice)  Indicate any recent Medical Services you may have received from other than [facility] providers in the past year (date may be approximate).     Assessment:   This is a routine wellness examination for Virginia Robles.  Hearing/Vision screen No results found.  Dietary issues and exercise activities discussed: Current Exercise Habits: Home exercise routine, Type of exercise: strength training/weights;calisthenics, Time (Minutes): 45, Frequency (Times/Week): 3, Weekly Exercise (Minutes/Week): 135   Goals Addressed             This Visit's Progress    Patient Stated       11/19/2021, no goals       Depression Screen PHQ 2/9 Scores 11/19/2021 08/19/2021 08/01/2020 11/28/2019 07/24/2019 06/07/2019 03/22/2019  PHQ - 2 Score 0 0 6 0 0 0 0  PHQ- 9 Score - - 17 - - - -    Fall Risk Fall Risk  11/19/2021 12/16/2020 11/28/2019 07/24/2019 06/07/2019  Falls in the past year? 0 0 0 0 1  Number falls in past yr: - - - - 0  Injury with Fall? - - - - 0  Risk  for fall due to : Medication side effect - - - -  Follow up Falls evaluation completed;Education provided;Falls prevention discussed - - - -    FALL RISK PREVENTION PERTAINING TO THE HOME:  Any stairs in or around the home? Yes  If so, are there any without handrails? No  Home free of loose throw rugs in walkways, pet beds, electrical cords, etc? Yes  Adequate lighting in your home to reduce risk of falls? Yes   ASSISTIVE DEVICES UTILIZED TO PREVENT FALLS:  Life  alert? No  Use of a cane, walker or w/c? No  Grab bars in the bathroom? No  Shower chair or bench in shower? No  Elevated toilet seat or a handicapped toilet? No   TIMED UP AND GO:  Was the test performed? No .     Gait steady and fast without use of assistive device  Cognitive Function:     6CIT Screen 11/19/2021 11/18/2020  What Year? 0 points 0 points  What month? 0 points 0 points  What time? 0 points 0 points  Count back from 20 0 points 0 points  Months in reverse 0 points 0 points  Repeat phrase 0 points 0 points  Total Score 0 0    Immunizations Immunization History  Administered Date(s) Administered   Fluad Quad(high Dose 65+) 07/09/2021   Influenza,inj,Quad PF,6+ Mos 11/09/2018   Influenza-Unspecified 07/13/2019   PFIZER(Purple Top)SARS-COV-2 Vaccination 12/02/2019, 12/23/2019, 08/01/2020    TDAP status: Up to date  Flu Vaccine status: Up to date  Pneumococcal vaccine status: Up to date  Covid-19 vaccine status: Completed vaccines  Qualifies for Shingles Vaccine? Yes   Zostavax completed No   Shingrix Completed?: Yes  Screening Tests Health Maintenance  Topic Date Due   Pneumonia Vaccine 69+ Years old (1 - PCV) Never done   Zoster Vaccines- Shingrix (1 of 2) Never done   COVID-19 Vaccine (4 - Booster for Pfizer series) 09/26/2020   MAMMOGRAM  06/03/2022   COLONOSCOPY (Pts 45-36yrs Insurance coverage will need to be confirmed)  07/31/2023   TETANUS/TDAP  03/14/2028   INFLUENZA  VACCINE  Completed   DEXA SCAN  Completed   Hepatitis C Screening  Completed   HPV VACCINES  Aged Out    Health Maintenance  Health Maintenance Due  Topic Date Due   Pneumonia Vaccine 78+ Years old (1 - PCV) Never done   Zoster Vaccines- Shingrix (1 of 2) Never done   COVID-19 Vaccine (4 - Booster for Pfizer series) 09/26/2020    Colorectal cancer screening: Type of screening: Colonoscopy. Completed 07/30/2021. Repeat every 2 years  Mammogram status: Completed 06/03/2021. Repeat every year  Bone Density status: Completed 03/21/2020.   Lung Cancer Screening: (Low Dose CT Chest recommended if Age 29-80 years, 30 pack-year currently smoking OR have quit w/in 15years.) does not qualify.   Lung Cancer Screening Referral: no  Additional Screening:  Hepatitis C Screening: does qualify; Completed 11/09/2018  Vision Screening: Recommended annual ophthalmology exams for early detection of glaucoma and other disorders of the eye. Is the patient up to date with their annual eye exam?  Yes  Who is the provider or what is the name of the office in which the patient attends annual eye exams? Dr. Venetia Maxon If pt is not established with a provider, would they like to be referred to a provider to establish care? No .   Dental Screening: Recommended annual dental exams for proper oral hygiene  Community Resource Referral / Chronic Care Management: CRR required this visit?  No   CCM required this visit?  No      Plan:     I have personally reviewed and noted the following in the patients chart:   Medical and social history Use of alcohol, tobacco or illicit drugs  Current medications and supplements including opioid prescriptions.  Functional ability and status Nutritional status Physical activity Advanced directives List of other physicians Hospitalizations, surgeries, and ER visits in previous 12 months Vitals Screenings to include cognitive, depression, and falls Referrals  and  appointments  In addition, I have reviewed and discussed with patient certain preventive protocols, quality metrics, and best practice recommendations. A written personalized care plan for preventive services as well as general preventive health recommendations were provided to patient.     Kellie Simmering, LPN   [id number]   Nurse Notes: none

## 2021-11-19 NOTE — Patient Instructions (Signed)
Ms. Virginia Robles , Thank you for taking time to come for your Medicare Wellness Visit. I appreciate your ongoing commitment to your health goals. Please review the following plan we discussed and let me know if I can assist you in the future.   Screening recommendations/referrals: Colonoscopy: completed 07/30/2021, due 07/31/2023 Mammogram: completed 06/03/2021 Bone Density: completed 03/21/2020 Recommended yearly ophthalmology/optometry visit for glaucoma screening and checkup Recommended yearly dental visit for hygiene and checkup  Vaccinations: Influenza vaccine: completed 07/09/2021 Pneumococcal vaccine: completed 10/28/2021 Tdap vaccine: completed 03/14/2018 Shingles vaccine: completed   Covid-19: 05/24/2021, 03/14/2021, 08/01/2020, 12/23/2019, 12/02/2019  Advanced directives: Please bring a copy of your POA (Power of Attorney) and/or Living Will to your next appointment.   Conditions/risks identified: none  Next appointment: Follow up in one year for your annual wellness visit    Preventive Care 67 Years and Older, Female Preventive care refers to lifestyle choices and visits with your health care provider that can promote health and wellness. What does preventive care include? A yearly physical exam. This is also called an annual well check. Dental exams once or twice a year. Routine eye exams. Ask your health care provider how often you should have your eyes checked. Personal lifestyle choices, including: Daily care of your teeth and gums. Regular physical activity. Eating a healthy diet. Avoiding tobacco and drug use. Limiting alcohol use. Practicing safe sex. Taking low-dose aspirin every day. Taking vitamin and mineral supplements as recommended by your health care provider. What happens during an annual well check? The services and screenings done by your health care provider during your annual well check will depend on your age, overall health, lifestyle risk factors, and family  history of disease. Counseling  Your health care provider may ask you questions about your: Alcohol use. Tobacco use. Drug use. Emotional well-being. Home and relationship well-being. Sexual activity. Eating habits. History of falls. Memory and ability to understand (cognition). Work and work Astronomer. Reproductive health. Screening  You may have the following tests or measurements: Height, weight, and BMI. Blood pressure. Lipid and cholesterol levels. These may be checked every 5 years, or more frequently if you are over 51 years old. Skin check. Lung cancer screening. You may have this screening every year starting at age 22 if you have a 30-pack-year history of smoking and currently smoke or have quit within the past 15 years. Fecal occult blood test (FOBT) of the stool. You may have this test every year starting at age 56. Flexible sigmoidoscopy or colonoscopy. You may have a sigmoidoscopy every 5 years or a colonoscopy every 10 years starting at age 55. Hepatitis C blood test. Hepatitis B blood test. Sexually transmitted disease (STD) testing. Diabetes screening. This is done by checking your blood sugar (glucose) after you have not eaten for a while (fasting). You may have this done every 1-3 years. Bone density scan. This is done to screen for osteoporosis. You may have this done starting at age 64. Mammogram. This may be done every 1-2 years. Talk to your health care provider about how often you should have regular mammograms. Talk with your health care provider about your test results, treatment options, and if necessary, the need for more tests. Vaccines  Your health care provider may recommend certain vaccines, such as: Influenza vaccine. This is recommended every year. Tetanus, diphtheria, and acellular pertussis (Tdap, Td) vaccine. You may need a Td booster every 10 years. Zoster vaccine. You may need this after age 49. Pneumococcal 13-valent conjugate (PCV13)  vaccine. One dose is recommended after age 52. Pneumococcal polysaccharide (PPSV23) vaccine. One dose is recommended after age 15. Talk to your health care provider about which screenings and vaccines you need and how often you need them. This information is not intended to replace advice given to you by your health care provider. Make sure you discuss any questions you have with your health care provider. Document Released: 11/08/2015 Document Revised: 07/01/2016 Document Reviewed: 08/13/2015 Elsevier Interactive Patient Education  2017 [employer] Prevention in the Home Falls can cause injuries. They can happen to people of all ages. There are many things you can do to make your home safe and to help prevent falls. What can I do on the outside of my home? Regularly fix the edges of walkways and driveways and fix any cracks. Remove anything that might make you trip as you walk through a door, such as a raised step or threshold. Trim any bushes or trees on the path to your home. Use bright outdoor lighting. Clear any walking paths of anything that might make someone trip, such as rocks or tools. Regularly check to see if handrails are loose or broken. Make sure that both sides of any steps have handrails. Any raised decks and porches should have guardrails on the edges. Have any leaves, snow, or ice cleared regularly. Use sand or salt on walking paths during winter. Clean up any spills in your garage right away. This includes oil or grease spills. What can I do in the bathroom? Use night lights. Install grab bars by the toilet and in the tub and shower. Do not use towel bars as grab bars. Use non-skid mats or decals in the tub or shower. If you need to sit down in the shower, use a plastic, non-slip stool. Keep the floor dry. Clean up any water that spills on the floor as soon as it happens. Remove soap buildup in the tub or shower regularly. Attach bath mats securely with  double-sided non-slip rug tape. Do not have throw rugs and other things on the floor that can make you trip. What can I do in the bedroom? Use night lights. Make sure that you have a light by your bed that is easy to reach. Do not use any sheets or blankets that are too big for your bed. They should not hang down onto the floor. Have a firm chair that has side arms. You can use this for support while you get dressed. Do not have throw rugs and other things on the floor that can make you trip. What can I do in the kitchen? Clean up any spills right away. Avoid walking on wet floors. Keep items that you use a lot in easy-to-reach places. If you need to reach something above you, use a strong step stool that has a grab bar. Keep electrical cords out of the way. Do not use floor polish or wax that makes floors slippery. If you must use wax, use non-skid floor wax. Do not have throw rugs and other things on the floor that can make you trip. What can I do with my stairs? Do not leave any items on the stairs. Make sure that there are handrails on both sides of the stairs and use them. Fix handrails that are broken or loose. Make sure that handrails are as long as the stairways. Check any carpeting to make sure that it is firmly attached to the stairs. Fix any carpet that is loose or  worn. Avoid having throw rugs at the top or bottom of the stairs. If you do have throw rugs, attach them to the floor with carpet tape. Make sure that you have a light switch at the top of the stairs and the bottom of the stairs. If you do not have them, ask someone to add them for you. What else can I do to help prevent falls? Wear shoes that: Do not have high heels. Have rubber bottoms. Are comfortable and fit you well. Are closed at the toe. Do not wear sandals. If you use a stepladder: Make sure that it is fully opened. Do not climb a closed stepladder. Make sure that both sides of the stepladder are locked  into place. Ask someone to hold it for you, if possible. Clearly mark and make sure that you can see: Any grab bars or handrails. First and last steps. Where the edge of each step is. Use tools that help you move around (mobility aids) if they are needed. These include: Canes. Walkers. Scooters. Crutches. Turn on the lights when you go into a dark area. Replace any light bulbs as soon as they burn out. Set up your furniture so you have a clear path. Avoid moving your furniture around. If any of your floors are uneven, fix them. If there are any pets around you, be aware of where they are. Review your medicines with your doctor. Some medicines can make you feel dizzy. This can increase your chance of falling. Ask your doctor what other things that you can do to help prevent falls. This information is not intended to replace advice given to you by your health care provider. Make sure you discuss any questions you have with your health care provider. Document Released: 08/08/2009 Document Revised: 03/19/2016 Document Reviewed: 11/16/2014 Elsevier Interactive Patient Education  2017 [employer].

## 2021-12-24 ENCOUNTER — Encounter: Payer: Self-pay | Admitting: Nurse Practitioner

## 2021-12-24 ENCOUNTER — Other Ambulatory Visit: Payer: Self-pay

## 2021-12-24 ENCOUNTER — Ambulatory Visit: Payer: Medicare PPO | Admitting: Nurse Practitioner

## 2021-12-24 VITALS — BP 130/74 | HR 59 | Temp 99.0°F | Ht 67.8 in | Wt 181.2 lb

## 2021-12-24 DIAGNOSIS — I739 Peripheral vascular disease, unspecified: Secondary | ICD-10-CM

## 2021-12-24 NOTE — Progress Notes (Signed)
?[employer] as a Neurosurgeon for [employer], FNP.,have documented all relevant documentation on the behalf of Arnette Felts, FNP,as directed by  Arnette Felts, FNP while in the presence of Arnette Felts, FNP.  ? ?This visit occurred during the SARS-CoV-2 public health emergency.  Safety protocols were in place, including screening questions prior to the visit, additional usage of staff PPE, and extensive cleaning of exam room while observing appropriate contact time as indicated for disinfecting solutions. ? ?Subjective:  ?  ? Patient ID: Virginia Robles , female    DOB: June 28, 1956 , 66 y.o.   MRN: [id number] ? ? ?Chief Complaint  ?Patient presents with  ? PAD  ? ? ?HPI ? ?The patient is here today for evaluation of PAD. She does have numbness to the ball of her left foot for less than 6 months. No longer wears heels does not matter what type of shoes. Does not feel cold. She had a visit from her insurance NP did PAD screening and left was borderline ?  ? ?Past Medical History:  ?Diagnosis Date  ? Hypertension   ? Ulcerative colitis   ?  ? ?Family History  ?Problem Relation Age of Onset  ? Hypertension Mother   ? Cancer Mother   ? Diabetes Father   ? Stroke Sister   ? Hypertension Sister   ? Hyperlipidemia Sister   ? Cancer Sister   ? Irritable bowel syndrome Sister   ? Breast cancer Niece 32  ? ? ? ?Current Outpatient Medications:  ?  Colchicine (MITIGARE) 0.6 MG CAPS, Take 1 tablet by mouth daily as needed., Disp: 30 capsule, Rfl: 2 ?  diclofenac sodium (VOLTAREN) 1 % GEL, Apply 2 g topically 4 (four) times daily., Disp: 50 g, Rfl: 1 ?  lisinopril (ZESTRIL) 40 MG tablet, TAKE 1 TABLET EVERY DAY, Disp: 90 tablet, Rfl: 0 ?  mesalamine (LIALDA) 1.2 g EC tablet, Take 1.2 g by mouth daily with breakfast. Patient taking as needed, Disp: , Rfl:  ?  Multiple Vitamins-Minerals (MULTIVITAMIN ADULT PO), Take 1 tablet by mouth daily., Disp: , Rfl:  ?  cyclobenzaprine (FLEXERIL) 10 MG tablet, Take 1 tablet (10 mg  total) by mouth 3 (three) times daily as needed for muscle spasms. (Patient not taking: Reported on 12/24/2021), Disp: 30 tablet, Rfl: 0  ? ?Allergies  ?Allergen Reactions  ? Elemental Sulfur Hives  ? Penicillins Hives  ?  ? ?Review of Systems  ?Constitutional: Negative.   ?Respiratory: Negative.    ?Cardiovascular: Negative.   ?Neurological:  Positive for numbness (to left foot at ball of foot).  ?Psychiatric/Behavioral: Negative.     ? ?Today's Vitals  ? 12/24/21 1201  ?BP: 130/74  ?Pulse: (!) 59  ?Temp: 99 ?F (37.2 ?C)  ?Weight: 181 lb 3.2 oz (82.2 kg)  ?Height: 5' 7.8" (1.722 m)  ?PainSc: 0-No pain  ? ?Body mass index is 27.71 kg/m?.  ? ?Objective:  ?Physical Exam ?Vitals reviewed.  ?Constitutional:   ?   General: She is not in acute distress. ?   Appearance: Normal appearance.  ?Cardiovascular:  ?   Rate and Rhythm: Normal rate and regular rhythm.  ?   Pulses: Normal pulses.  ?   Heart sounds: Normal heart sounds. No murmur heard. ?Pulmonary:  ?   Effort: Pulmonary effort is normal. No respiratory distress.  ?   Breath sounds: No wheezing.  ?Musculoskeletal:     ?   General: Normal range of motion.  ?Skin: ?   General: Skin  is warm and dry.  ?Neurological:  ?   General: No focal deficit present.  ?   Mental Status: She is alert and oriented to person, place, and time.  ?   Cranial Nerves: No cranial nerve deficit.  ?   Motor: No weakness.  ?Psychiatric:     ?   Mood and Affect: Mood normal.     ?   Behavior: Behavior normal.     ?   Thought Content: Thought content normal.     ?   Judgment: Judgment normal.  ?  ? ?   ?Assessment And Plan:  ?   ?1. PAD (peripheral artery disease) (HCC) ?Comments: Will refer to VV, had borderline PAD screening at home. Has mild numbness to left foot on ball/padding. Foot exam done normal sensation ?- Ambulatory referral to Vascular Surgery ?  ? ? ?Patient was given opportunity to ask questions. Patient verbalized understanding of the plan and was able to repeat key elements of the  plan. All questions were answered to their satisfaction.  ?Arnette Felts, FNP  ? ?I, Arnette Felts, FNP, have reviewed all documentation for this visit. The documentation on 12/24/21 for the exam, diagnosis, procedures, and orders are all accurate and complete.  ? ?IF YOU HAVE BEEN REFERRED TO A SPECIALIST, IT MAY TAKE 1-2 WEEKS TO SCHEDULE/PROCESS THE REFERRAL. IF YOU HAVE NOT HEARD FROM US/SPECIALIST IN TWO WEEKS, PLEASE GIVE [place] A CALL AT [phone] X [phone].  ? ?THE PATIENT IS ENCOURAGED TO PRACTICE SOCIAL DISTANCING DUE TO THE COVID-19 PANDEMIC.   ?

## 2021-12-24 NOTE — Patient Instructions (Signed)
Peripheral Vascular Disease °Peripheral vascular disease (PVD) is a disease of the blood vessels. PVD may also be called peripheral artery disease (PAD) or poor circulation. PVD is the blocking or hardening of the arteries anywhere within the circulatory system beyond the heart. This can result in a decreased supply of blood to the arms, legs, and internal organs, such as the stomach or kidneys. However, PVD most often affects a person's lower legs and feet. Without treatment, PVD often worsens. °PVD can lead to acute limb ischemia. This occurs when an arm or leg suddenly has trouble getting enough blood. This is a medical emergency. °What are the causes? °The most common cause of PVD is atherosclerosis. This is a buildup of fatty material and other substances (plaque)inside your arteries. Pieces of plaque can break off from the walls of an artery and become stuck in a smaller artery, blocking blood flow and possibly causing acute limb ischemia. °Other common causes of PVD include: °Blood clots that form inside the blood vessels. °Injuries to blood vessels. °Diseases that cause inflammation of blood vessels or cause blood vessel tightening (spasms). °What increases the risk? °The following factors may make you more likely to develop this condition: °A family history of PVD. °Common medical conditions, including: °High cholesterol. °Diabetes. °High blood pressure (hypertension). °Heart disease. °Known atherosclerotic disease in another area of the body. °Past injury, such as burns or a broken bone. °Other medical conditions, such as: °Buerger's disease. This is caused by inflamed blood vessels in your hands and feet. °Some forms of arthritis. °Birth defects that affect the arteries in your legs. °Kidney disease. °Using tobacco and nicotine products. °Not getting enough exercise. °Obesity. °Being age 65 or older, or being age 50 or older and having the other risk factors. °What are the signs or symptoms? °This  condition may cause different symptoms. Your symptoms depend on what body part is not getting enough blood. Common signs and symptoms include: °Cramps in your buttocks, legs, and feet. °Intermittent claudication. This is pain and weakness in your legs during activity that resolves with rest. °Leg pain at rest and leg numbness, tingling, or weakness. °Coldness in a leg or foot, especially when compared to the other leg or foot. °Skin or hair changes. These can include: °Hair loss. °Shiny skin. °Pale or bluish skin. °Thick toenails. °Inability to get or maintain an erection (erectile dysfunction). °Tiredness (fatigue). °Weak pulse or no pulse in the feet. °People with PVD are more likely to develop open wounds (ulcers) and sores on their toes, feet, or legs. The ulcers or sores may take longer than normal to heal. °How is this diagnosed? °PVD is diagnosed based on your signs and symptoms, a physical exam, and your medical history. You may also have other tests to find the cause. Tests include: °Ankle-brachial index test.This test compares the blood pressure readings of the legs and arms. °This may also include an exercise ankle-brachial index test in which you walk on a treadmill to check your symptoms. °Doppler ultrasound. This takes pictures of blood flow through your blood vessels. °Imaging studies that use dye to show blood flow. These are: °CT angiogram. °Magnetic resonance angiogram, or MRA. °How is this treated? °Treatment for PVD depends on the cause of your condition, how severe your symptoms are, and your age. Underlying causes need to be treated and controlled. These include long-term (chronic) conditions, such as diabetes, high cholesterol, and hypertension. Treatment may include: °Lifestyle changes, such as: °Quitting tobacco use. °Exercising regularly. °Following a low-fat,   low-cholesterol diet. °Not drinking alcohol. °Taking medicines, such as: °Blood thinners to prevent blood clots. °Medicines to  improve blood flow. °Medicines to improve cholesterol levels. °Procedures, such as: °Angioplasty. This uses an inflated balloon to open a blocked artery and improve blood flow. °Stent implant. This inserts a small mesh tube to keep a blocked artery open. °Peripheral bypass surgery. This reroutes blood flow around a blocked artery. °Surgery to remove dead tissue from an infected wound (debridement). °Amputation. This is surgical removal of the affected limb. It may be necessary in cases of acute limb ischemia when medical or surgical treatments have not helped. °Follow these instructions at home: °Medicines °Take over-the-counter and prescription medicines only as told by your health care provider. °If you are taking blood thinners: °Talk with your health care provider before you take any medicines that contain aspirin or NSAIDs, such as ibuprofen. These medicines increase your risk for dangerous bleeding. °Take your medicine exactly as told, at the same time every day. °Avoid activities that could cause injury or bruising, and follow instructions about how to prevent falls. °Wear a medical alert bracelet or carry a card that lists what medicines you take. °Lifestyle °  °Exercise regularly. Ask your health care provider about some good activities for you. °Talk with your health care provider about maintaining a healthy weight. If needed, ask about losing weight. °Eat a diet that is low in fat and cholesterol. If you need help, talk with your health care provider. °Do not drink alcohol. °Do not use any products that contain nicotine or tobacco. These products include cigarettes, chewing tobacco, and vaping devices, such as e-cigarettes. If you need help quitting, ask your health care provider. °General instructions °Take good care of your feet. To do this: °Wear comfortable shoes that fit well. °Check your feet often for any cuts or sores. °Get an annual influenza vaccine. °Keep all follow-up visits. This is  important. °Where to find more information °Society for Vascular Surgery: vascular.org °American Heart Association: heart.org °National Heart, Lung, and Blood Institute: nhlbi.nih.gov °Contact a health care provider if: °You have leg cramps while walking. °You have leg pain when you rest. °Your leg or foot feels cold. °Your skin changes color. °You have erectile dysfunction. °You have cuts or sores on your legs or feet that do not heal. °Get help right away if: °You have sudden changes in color and feeling of your arms or legs, such as: °Your arm or leg turns cold, numb, and blue. °Your arm or leg becomes red, warm, swollen, painful, or numb. °You have any symptoms of a stroke. "BE FAST" is an easy way to remember the main warning signs of a stroke: °B - Balance. Signs are dizziness, sudden trouble walking, or loss of balance. °E - Eyes. Signs are trouble seeing or a sudden change in vision. °F - Face. Signs are sudden weakness or numbness of the face, or the face or eyelid drooping on one side. °A - Arms. Signs are weakness or numbness in an arm. This happens suddenly and usually on one side of the body. °S - Speech. Signs are sudden trouble speaking, slurred speech, or trouble understanding what people say. °T - Time. Time to call emergency services. Write down what time symptoms started. °You have other signs of a stroke, such as: °A sudden, severe headache with no known cause. °Nausea or vomiting. °Seizure. °You have chest pain or trouble breathing. °These symptoms may represent a serious problem that is an emergency. Do not wait   to see if the symptoms will go away. Get medical help right away. Call your local emergency services (911 in the [place] Do not drive yourself to the hospital. °Summary °Peripheral vascular disease (PVD) is a disease of the blood vessels. °PVD is the blocking or hardening of the arteries anywhere within the circulatory system beyond the heart. °PVD may cause different symptoms. Your  symptoms depend on what part of your body is not getting enough blood. °Treatment for PVD depends on what caused it, how severe your symptoms are, and your age. °This information is not intended to replace advice given to you by your health care provider. Make sure you discuss any questions you have with your health care provider. °Document Revised: 04/15/2020 Document Reviewed: 04/15/2020 °Elsevier Patient Education © 2022 Elsevier Inc. ° °

## 2022-01-06 ENCOUNTER — Ambulatory Visit: Payer: Medicare PPO | Admitting: Nurse Practitioner

## 2022-03-29 ENCOUNTER — Encounter: Payer: Self-pay | Admitting: Nurse Practitioner

## 2022-03-30 ENCOUNTER — Other Ambulatory Visit: Payer: Self-pay

## 2022-03-30 DIAGNOSIS — M1A0710 Idiopathic chronic gout, right ankle and foot, without tophus (tophi): Secondary | ICD-10-CM

## 2022-03-30 MED ORDER — COLCHICINE 0.6 MG PO CAPS: 1.0000 | ORAL_CAPSULE | Freq: Every day | ORAL | 2 refills | Status: DC | PRN

## 2022-04-16 ENCOUNTER — Other Ambulatory Visit: Payer: Self-pay | Admitting: Nurse Practitioner

## 2022-06-02 DIAGNOSIS — M25572 Pain in left ankle and joints of left foot: Secondary | ICD-10-CM | POA: Diagnosis not present

## 2022-06-05 DIAGNOSIS — M25572 Pain in left ankle and joints of left foot: Secondary | ICD-10-CM | POA: Diagnosis not present

## 2022-06-09 DIAGNOSIS — Z1231 Encounter for screening mammogram for malignant neoplasm of breast: Secondary | ICD-10-CM | POA: Diagnosis not present

## 2022-06-11 DIAGNOSIS — R928 Other abnormal and inconclusive findings on diagnostic imaging of breast: Secondary | ICD-10-CM | POA: Diagnosis not present

## 2022-06-11 LAB — HM MAMMOGRAPHY: HM Mammogram: NORMAL (ref 0–4)

## 2022-06-15 ENCOUNTER — Encounter: Payer: Self-pay | Admitting: Nurse Practitioner

## 2022-06-17 ENCOUNTER — Encounter: Payer: Self-pay | Admitting: Nurse Practitioner

## 2022-06-17 ENCOUNTER — Ambulatory Visit: Payer: Medicare PPO | Admitting: Nurse Practitioner

## 2022-06-17 VITALS — BP 160/80 | HR 80 | Temp 98.9°F | Ht 68.0 in | Wt 177.4 lb

## 2022-06-17 DIAGNOSIS — M10072 Idiopathic gout, left ankle and foot: Secondary | ICD-10-CM | POA: Diagnosis not present

## 2022-06-17 DIAGNOSIS — M79672 Pain in left foot: Secondary | ICD-10-CM

## 2022-06-17 MED ORDER — COLCHICINE 0.6 MG PO TABS: 0.6000 mg | ORAL_TABLET | Freq: Every day | ORAL | 2 refills | Status: AC

## 2022-06-17 MED ORDER — TRIAMCINOLONE ACETONIDE 40 MG/ML IJ SUSP
40.0000 mg | Freq: Once | INTRAMUSCULAR | Status: AC
  Administered 2022-06-17: 40 mg via INTRAMUSCULAR

## 2022-06-17 MED ORDER — KETOROLAC TROMETHAMINE 30 MG/ML IJ SOLN
30.0000 mg | Freq: Once | INTRAMUSCULAR | Status: AC
  Administered 2022-06-17: 30 mg via INTRAMUSCULAR

## 2022-06-17 MED ORDER — ALLOPURINOL 100 MG PO TABS: 100.0000 mg | ORAL_TABLET | Freq: Every day | ORAL | 2 refills | Status: AC

## 2022-06-17 NOTE — Progress Notes (Deleted)
[employer]ers[employer] Phaedra [employer],acting as a Neurosurgeon for Arnette Felts, FNP.,have documented all relevant documentation on the behalf of Arnette Felts, FNP,as directed by  Arnette Felts, FNP while in the presence of Arnette Felts, FNP.    Subjective:     Patient ID: Virginia Robles , female    DOB: 06-29-1956 , 66 y.o.   MRN: [id number]   Chief Complaint  Patient presents with   Foot Swelling    HPI  Patient presents today with a swollen foot, she states it started earlier this year and at first the pills that were prescribed work she states now the pills are no longer working. This time she first noticed the swollen left foot the first of July she states it is very painful at night and she can no longer walk on it.  BP Readings from Last 3 Encounters: 06/17/22 : (!) 160/80 12/24/21 : 130/74 11/19/21 : 116/64       Past Medical History:  Diagnosis Date   Hypertension    Ulcerative colitis      Family History  Problem Relation Age of Onset   Hypertension Mother    Cancer Mother    Diabetes Father    Stroke Sister    Hypertension Sister    Hyperlipidemia Sister    Cancer Sister    Irritable bowel syndrome Sister    Breast cancer Niece 48     Current Outpatient Medications:    Colchicine (MITIGARE) 0.6 MG CAPS, Take 1 tablet by mouth daily as needed., Disp: 30 capsule, Rfl: 2   diclofenac sodium (VOLTAREN) 1 % GEL, Apply 2 g topically 4 (four) times daily., Disp: 50 g, Rfl: 1   lisinopril (ZESTRIL) 40 MG tablet, TAKE 1 TABLET EVERY DAY, Disp: 90 tablet, Rfl: 0   mesalamine (LIALDA) 1.2 g EC tablet, Take 1.2 g by mouth daily with breakfast. Patient taking as needed, Disp: , Rfl:    Multiple Vitamins-Minerals (MULTIVITAMIN ADULT PO), Take 1 tablet by mouth daily., Disp: , Rfl:    Allergies  Allergen Reactions   Elemental Sulfur Hives   Penicillins Hives     Review of Systems  Constitutional: Negative.   HENT: Negative.    Eyes: Negative.   Respiratory: Negative.     Cardiovascular: Negative.   Gastrointestinal: Negative.      Today's Vitals   06/17/22 1109  BP: (!) 160/80  Pulse: 80  Temp: 98.9 F (37.2 C)  TempSrc: Oral  Weight: 177 lb 6.4 oz (80.5 kg)  Height: 5\' 8"  (1.727 m)  PainSc: 10-Worst pain ever  PainLoc: Foot   Body mass index is 26.97 kg/m.  Wt Readings from Last 3 Encounters:  06/17/22 177 lb 6.4 oz (80.5 kg)  12/24/21 181 lb 3.2 oz (82.2 kg)  11/19/21 177 lb 12.8 oz (80.6 kg)    Objective:  Physical Exam      Assessment And Plan:     There are no diagnoses linked to this encounter.    Patient was given opportunity to ask questions. Patient verbalized understanding of the plan and was able to repeat key elements of the plan. All questions were answered to their satisfaction.  11/21/21, CMA   I, Marlyn Corporal, CMA, have reviewed all documentation for this visit. The documentation on 06/17/22 for the exam, diagnosis, procedures, and orders are all accurate and complete.   IF YOU HAVE BEEN REFERRED TO A SPECIALIST, IT MAY TAKE 1-2 WEEKS TO SCHEDULE/PROCESS THE REFERRAL. IF YOU HAVE NOT HEARD  FROM US/SPECIALIST IN TWO WEEKS, PLEASE GIVE [place] A CALL AT [phone].   THE PATIENT IS ENCOURAGED TO PRACTICE SOCIAL DISTANCING DUE TO THE COVID-19 PANDEMIC.

## 2022-06-17 NOTE — Patient Instructions (Addendum)
Edema  Edema is an abnormal buildup of fluids in the body tissues and under the skin. Swelling of the legs, feet, and ankles is a common symptom that becomes more likely as you get older. Swelling is also common in looser tissues, such as around the eyes. Pressing on the area may make a temporary dent in your skin (pitting edema). This fluid may also accumulate in your lungs (pulmonary edema). There are many possible causes of edema. Eating too much salt (sodium) and being on your feet or sitting for a long time can cause edema in your legs, feet, and ankles. Common causes of edema include: Certain medical conditions, such as heart failure, liver or kidney disease, and cancer. Weak leg blood vessels. An injury. Pregnancy. Medicines. Being obese. Low protein levels in the blood. Hot weather may make edema worse. Edema is usually painless. Your skin may look swollen or shiny. Follow these instructions at home: Medicines Take over-the-counter and prescription medicines only as told by your health care provider. Your health care provider may prescribe a medicine to help your body get rid of extra water (diuretic). Take this medicine if you are told to take it. Eating and drinking Eat a low-salt (low-sodium) diet to reduce fluid as told by your health care provider. Sometimes, eating less salt may reduce swelling. Depending on the cause of your swelling, you may need to limit how much fluid you drink (fluid restriction). General instructions Raise (elevate) the injured area above the level of your heart while you are sitting or lying down. Do not sit still or stand for long periods of time. Do not wear tight clothing. Do not wear garters on your upper legs. Exercise your legs to get your circulation going. This helps to move the fluid back into your blood vessels, and it may help the swelling go down. Wear compression stockings as told by your health care provider. These stockings help to prevent  blood clots and reduce swelling in your legs. It is important that these are the correct size. These stockings should be prescribed by your health care provider to prevent possible injuries. If elastic bandages or wraps are recommended, use them as told by your health care provider. Contact a health care provider if: Your edema does not get better with treatment. You have heart, liver, or kidney disease and have symptoms of edema. You have sudden and unexplained weight gain. Get help right away if: You develop shortness of breath or chest pain. You cannot breathe when you lie down. You develop pain, redness, or warmth in the swollen areas. You have heart, liver, or kidney disease and suddenly get edema. You have a fever and your symptoms suddenly get worse. These symptoms may be an emergency. Get help right away. Call 911. Do not wait to see if the symptoms will go away. Do not drive yourself to the hospital. Summary Edema is an abnormal buildup of fluids in the body tissues and under the skin. Eating too much salt (sodium)and being on your feet or sitting for a long time can cause edema in your legs, feet, and ankles. Raise (elevate) the injured area above the level of your heart while you are sitting or lying down. Follow your health care provider's instructions about diet and how much fluid you can drink. This information is not intended to replace advice given to you by your health care provider. Make sure you discuss any questions you have with your health care provider. Document Revised: 06/16/2021 Document   Reviewed: 06/16/2021 Elsevier Patient Education  2023 Elsevier Inc.  

## 2022-06-17 NOTE — Progress Notes (Signed)
I,Tianna Badgett,acting as a Education administrator for [employer], FNP.,have documented all relevant documentation on the behalf of Minette Brine, FNP,as directed by  Minette Brine, FNP while in the presence of Minette Brine, [place].  Subjective:     Patient ID: Virginia Robles , female    DOB: 1956-06-21 , 66 y.o.   MRN: [id number]   Chief Complaint  Patient presents with   Foot Swelling    HPI  Patient presents today for foot swelling since July 4th. Does not improve over the last 2 weeks with the pain worse at night. She is having difficulty with walking. She is drinking 3-4 bottles of water a day. She did notice the swelling after eating shellfish.      Past Medical History:  Diagnosis Date   Hypertension    Ulcerative colitis      Family History  Problem Relation Age of Onset   Hypertension Mother    Cancer Mother    Diabetes Father    Stroke Sister    Hypertension Sister    Hyperlipidemia Sister    Cancer Sister    Irritable bowel syndrome Sister    Breast cancer Niece 61     Current Outpatient Medications:    allopurinol (ZYLOPRIM) 100 MG tablet, Take 1 tablet (100 mg total) by mouth daily., Disp: 30 tablet, Rfl: 2   colchicine 0.6 MG tablet, Take 1 tablet (0.6 mg total) by mouth daily. As needed, Disp: 30 tablet, Rfl: 2   diclofenac sodium (VOLTAREN) 1 % GEL, Apply 2 g topically 4 (four) times daily., Disp: 50 g, Rfl: 1   lisinopril (ZESTRIL) 40 MG tablet, TAKE 1 TABLET EVERY DAY, Disp: 90 tablet, Rfl: 0   mesalamine (LIALDA) 1.2 g EC tablet, Take 1.2 g by mouth daily with breakfast. Patient taking as needed, Disp: , Rfl:    Multiple Vitamins-Minerals (MULTIVITAMIN ADULT PO), Take 1 tablet by mouth daily., Disp: , Rfl:    Allergies  Allergen Reactions   Elemental Sulfur Hives   Penicillins Hives     Review of Systems  Constitutional: Negative.   Respiratory: Negative.    Cardiovascular: Negative.   Gastrointestinal: Negative.   Musculoskeletal:  Positive for joint  swelling.  Neurological: Negative.      Today's Vitals   06/17/22 1109  BP: (!) 160/80  Pulse: 80  Temp: 98.9 F (37.2 C)  TempSrc: Oral  Weight: 177 lb 6.4 oz (80.5 kg)  Height: _0  (1.727 m)  PainSc: 10-Worst pain ever  PainLoc: Foot   Body mass index is 26.97 kg/m.   Objective:  Physical Exam Vitals reviewed.  Constitutional:      General: She is not in acute distress.    Appearance: Normal appearance.  Cardiovascular:     Rate and Rhythm: Normal rate and regular rhythm.     Pulses: Normal pulses.     Heart sounds: Normal heart sounds. No murmur heard. Pulmonary:     Effort: Pulmonary effort is normal. No respiratory distress.     Breath sounds: No wheezing.  Musculoskeletal:        General: Swelling (left foot) and tenderness (tenderness to left ankle and dorsal foot) present. Normal range of motion.  Skin:    General: Skin is warm and dry.  Neurological:     General: No focal deficit present.     Mental Status: She is alert and oriented to person, place, and time.     Cranial Nerves: No cranial nerve deficit.  Motor: No weakness.  Psychiatric:        Mood and Affect: Mood normal.        Behavior: Behavior normal.        Thought Content: Thought content normal.        Judgment: Judgment normal.         Assessment And Plan:     1. Left foot pain Comments: Swelling with tenderness to left ankle and dorsal surface of foot. Will treat with Toradol and Kenalog.  - Uric acid - triamcinolone acetonide (KENALOG-40) injection 40 mg - ketorolac (TORADOL) 30 MG/ML injection 30 mg  2. Acute idiopathic gout of left foot Comments: Advised to take allopurinol once no longer having pain or swelling. Will check Uric acid.  - allopurinol (ZYLOPRIM) 100 MG tablet; Take 1 tablet (100 mg total) by mouth daily.  Dispense: 30 tablet; Refill: 2 - Uric acid - triamcinolone acetonide (KENALOG-40) injection 40 mg - ketorolac (TORADOL) 30 MG/ML injection 30 mg -  colchicine 0.6 MG tablet; Take 1 tablet (0.6 mg total) by mouth daily. As needed  Dispense: 30 tablet; Refill: 2 - BMP8+eGFR    Patient was given opportunity to ask questions. Patient verbalized understanding of the plan and was able to repeat key elements of the plan. All questions were answered to their satisfaction.  Minette Brine, FNP   I, Minette Brine, FNP, have reviewed all documentation for this visit. The documentation on 06/17/22 for the exam, diagnosis, procedures, and orders are all accurate and complete.   IF YOU HAVE BEEN REFERRED TO A SPECIALIST, IT MAY TAKE 1-2 WEEKS TO SCHEDULE/PROCESS THE REFERRAL. IF YOU HAVE NOT HEARD FROM US/SPECIALIST IN TWO WEEKS, PLEASE GIVE [place] A CALL AT [phone].   THE PATIENT IS ENCOURAGED TO PRACTICE SOCIAL DISTANCING DUE TO THE COVID-19 PANDEMIC.

## 2022-06-18 LAB — BMP8+EGFR
BUN/Creatinine Ratio: 11 — ABNORMAL LOW (ref 12–28)
BUN: 9 mg/dL (ref 8–27)
CO2: 17 mmol/L — ABNORMAL LOW (ref 20–29)
Calcium: 10.9 mg/dL — ABNORMAL HIGH (ref 8.7–10.3)
Chloride: 103 mmol/L (ref 96–106)
Creatinine, Ser: 0.79 mg/dL (ref 0.57–1.00)
Glucose: 77 mg/dL (ref 70–99)
Potassium: 4.5 mmol/L (ref 3.5–5.2)
Sodium: 139 mmol/L (ref 134–144)
eGFR: 82 mL/min/{1.73_m2} (ref >59)

## 2022-06-18 LAB — URIC ACID: Uric Acid: 6.4 mg/dL (ref 3.0–7.2)

## 2022-06-22 NOTE — Progress Notes (Signed)
Call to check if any better, if not send rx for prednisone 40 mg once a day for 3 days

## 2022-06-29 ENCOUNTER — Encounter: Payer: Self-pay | Admitting: Nurse Practitioner

## 2022-06-30 ENCOUNTER — Other Ambulatory Visit: Payer: Self-pay

## 2022-06-30 ENCOUNTER — Emergency Department (HOSPITAL_BASED_OUTPATIENT_CLINIC_OR_DEPARTMENT_OTHER)
Admission: EM | Admit: 2022-06-30 | Discharge: 2022-06-30 | Disposition: A | Discharge status: Home / Self Care | Payer: Medicare PPO | Attending: Emergency Medicine | Admitting: Emergency Medicine

## 2022-06-30 ENCOUNTER — Emergency Department (HOSPITAL_BASED_OUTPATIENT_CLINIC_OR_DEPARTMENT_OTHER): Payer: Medicare PPO

## 2022-06-30 ENCOUNTER — Encounter (HOSPITAL_BASED_OUTPATIENT_CLINIC_OR_DEPARTMENT_OTHER): Payer: Self-pay | Admitting: Obstetrics and Gynecology

## 2022-06-30 DIAGNOSIS — S0993XA Unspecified injury of face, initial encounter: Secondary | ICD-10-CM | POA: Diagnosis present

## 2022-06-30 DIAGNOSIS — Z79899 Other long term (current) drug therapy: Secondary | ICD-10-CM | POA: Insufficient documentation

## 2022-06-30 DIAGNOSIS — R55 Syncope and collapse: Secondary | ICD-10-CM | POA: Insufficient documentation

## 2022-06-30 DIAGNOSIS — I10 Essential (primary) hypertension: Secondary | ICD-10-CM | POA: Diagnosis not present

## 2022-06-30 DIAGNOSIS — S0592XA Unspecified injury of left eye and orbit, initial encounter: Secondary | ICD-10-CM | POA: Diagnosis not present

## 2022-06-30 DIAGNOSIS — S0003XA Contusion of scalp, initial encounter: Secondary | ICD-10-CM | POA: Diagnosis not present

## 2022-06-30 DIAGNOSIS — S0512XA Contusion of eyeball and orbital tissues, left eye, initial encounter: Secondary | ICD-10-CM | POA: Diagnosis not present

## 2022-06-30 DIAGNOSIS — K118 Other diseases of salivary glands: Secondary | ICD-10-CM | POA: Insufficient documentation

## 2022-06-30 DIAGNOSIS — H1132 Conjunctival hemorrhage, left eye: Secondary | ICD-10-CM | POA: Insufficient documentation

## 2022-06-30 DIAGNOSIS — W1839XA Other fall on same level, initial encounter: Secondary | ICD-10-CM | POA: Diagnosis not present

## 2022-06-30 DIAGNOSIS — S0990XA Unspecified injury of head, initial encounter: Secondary | ICD-10-CM | POA: Diagnosis not present

## 2022-06-30 LAB — CBC
HCT: 32.4 % — ABNORMAL LOW (ref 36.0–46.0)
Hemoglobin: 11.5 g/dL — ABNORMAL LOW (ref 12.0–15.0)
MCH: 29 pg (ref 26.0–34.0)
MCHC: 35.5 g/dL (ref 30.0–36.0)
MCV: 81.6 fL (ref 80.0–100.0)
Platelets: 414 10*3/uL — ABNORMAL HIGH (ref 150–400)
RBC: 3.97 MIL/uL (ref 3.87–5.11)
RDW: 13.3 % (ref 11.5–15.5)
WBC: 7 10*3/uL (ref 4.0–10.5)
nRBC: 0 % (ref 0.0–0.2)

## 2022-06-30 LAB — CBG MONITORING, ED: Glucose-Capillary: 105 mg/dL — ABNORMAL HIGH (ref 70–99)

## 2022-06-30 LAB — BASIC METABOLIC PANEL
Anion gap: 8 (ref 5–15)
BUN: 16 mg/dL (ref 8–23)
CO2: 24 mmol/L (ref 22–32)
Calcium: 11.8 mg/dL — ABNORMAL HIGH (ref 8.9–10.3)
Chloride: 103 mmol/L (ref 98–111)
Creatinine, Ser: 0.9 mg/dL (ref 0.44–1.00)
GFR, Estimated: >60 mL/min (ref >60)
Glucose, Bld: 114 mg/dL — ABNORMAL HIGH (ref 70–99)
Potassium: 4 mmol/L (ref 3.5–5.1)
Sodium: 135 mmol/L (ref 135–145)

## 2022-06-30 LAB — URINALYSIS, ROUTINE W REFLEX MICROSCOPIC
Bilirubin Urine: NEGATIVE
Glucose, UA: NEGATIVE mg/dL
Hgb urine dipstick: NEGATIVE
Ketones, ur: NEGATIVE mg/dL
Leukocytes,Ua: NEGATIVE
Nitrite: NEGATIVE
Protein, ur: NEGATIVE mg/dL
Specific Gravity, Urine: 1.01 (ref 1.005–1.030)
pH: 5.5 (ref 5.0–8.0)

## 2022-06-30 NOTE — ED Provider Notes (Signed)
[facility] EMERGENCY DEPT Provider Note   CSN: [id number] Arrival date & time: 06/30/22  1453     History  Chief Complaint  Patient presents with   Loss of Consciousness    Virginia Robles is a 66 y.o. female.  The history is provided by the patient and medical records. No language interpreter was used.  Loss of Consciousness    66 year old female with history of hypertension, UC, presenting with facial injury.  Patient reports she passed out 4 days ago and struck her head.  States she was sleeping in bed and perhaps she may have falling off her bed.  Family member did hear her falling and guided back to bed.  She reported she proceeded to sleep throughout most of the day until 3 PM.  She then noticed bruising around her left eye.  Bruising seems to be getting progressively worse with swelling to her left eyebrow which concerns her.  She does not endorse any significant fever or chills no severe headache no vision changes no pain with eye movement no lightheadedness or dizziness no nausea vomiting diarrhea no confusion no numbness or weakness and no neck pain.  Patient is not on blood thinner medication.  She did recall being diagnosed with gout last week and was started on allopurinol and colchicine that she took for 1 day prior to her incident.  She has stopped the medication since.  She does not endorse any history of seizure.  Denies tongue biting or bowel bladder incontinence.  Home Medications Prior to Admission medications   Medication Sig Start Date End Date Taking? Authorizing Provider  allopurinol (ZYLOPRIM) 100 MG tablet Take 1 tablet (100 mg total) by mouth daily. 06/17/22 06/17/23  Arnette Felts, FNP  colchicine 0.6 MG tablet Take 1 tablet (0.6 mg total) by mouth daily. As needed 06/17/22 06/17/23  Arnette Felts, FNP  diclofenac sodium (VOLTAREN) 1 % GEL Apply 2 g topically 4 (four) times daily. 06/07/19   Arnette Felts, FNP  lisinopril (ZESTRIL) 40 MG tablet  TAKE 1 TABLET EVERY DAY 04/16/22   Arnette Felts, FNP  mesalamine (LIALDA) 1.2 g EC tablet Take 1.2 g by mouth daily with breakfast. Patient taking as needed    [provider]  Multiple Vitamins-Minerals (MULTIVITAMIN ADULT PO) Take 1 tablet by mouth daily.    [provider]      Allergies    Elemental sulfur and Penicillins    Review of Systems   Review of Systems  Cardiovascular:  Positive for syncope.  All other systems reviewed and are negative.   Physical Exam Updated Vital Signs BP (!) 161/82   Pulse 78   Temp 98.3 F (36.8 C) (Oral)   Resp 15   Ht 5\' 8"  (1.727 m)   Wt 77.1 kg   SpO2 100%   BMI 25.85 kg/m  Physical Exam Vitals and nursing note reviewed.  Constitutional:      General: She is not in acute distress.    Appearance: She is well-developed.  HENT:     Head: Normocephalic.     Comments: Face: Ecchymosis noted to left eyebrow with raccoon's eyes to left orbital region.  Subconjunctival hemorrhage involving most of the left conjunctiva.  No hemotympanum no septal hematoma no midface tenderness no malocclusion Eyes:     Extraocular Movements: Extraocular movements intact.     Pupils: Pupils are equal, round, and reactive to light.     Comments: Left orbital region: Left subconjunctival hemorrhage.  Periorbital ecchymosis noted  tenderness along orbital rim.  Cardiovascular:     Rate and Rhythm: Normal rate and regular rhythm.  Pulmonary:     Effort: Pulmonary effort is normal.  Abdominal:     Palpations: Abdomen is soft.     Tenderness: There is no abdominal tenderness.  Musculoskeletal:     Cervical back: Neck supple.  Skin:    Findings: No rash.  Neurological:     Mental Status: She is alert.  Psychiatric:        Mood and Affect: Mood normal.     ED Results / Procedures / Treatments   Labs (all labs ordered are listed, but only abnormal results are displayed) Labs Reviewed  BASIC METABOLIC PANEL - Abnormal; Notable for the  following components:      Result Value   Glucose, Bld 114 (*)    Calcium 11.8 (*)    All other components within normal limits  CBC - Abnormal; Notable for the following components:   Hemoglobin 11.5 (*)    HCT 32.4 (*)    Platelets 414 (*)    All other components within normal limits  CBG MONITORING, ED - Abnormal; Notable for the following components:   Glucose-Capillary 105 (*)    All other components within normal limits  URINALYSIS, ROUTINE W REFLEX MICROSCOPIC    EKG EKG Interpretation  Date/Time:  Tuesday June 30 2022 15:08:45 EDT Ventricular Rate:  74 PR Interval:  166 QRS Duration: 74 QT Interval:  346 QTC Calculation: 384 R Axis:   -8 Text Interpretation: Normal sinus rhythm Minimal voltage criteria for LVH, may be normal variant ( R in aVL ) Inferior infarct , age undetermined Possible Anterior infarct , age undetermined Abnormal ECG No previous ECGs available Confirmed by Ernie Avena ([id number]) on 06/30/2022 3:36:16 PM  Radiology CT HEAD WO CONTRAST  Result Date: 06/30/2022 CLINICAL DATA:  Facial injury after fall.  Loss of consciousness. EXAM: CT HEAD WITHOUT CONTRAST CT MAXILLOFACIAL WITHOUT CONTRAST TECHNIQUE: Multidetector CT imaging of the head and maxillofacial structures were performed using the standard protocol without intravenous contrast. Multiplanar CT image reconstructions of the maxillofacial structures were also generated. RADIATION DOSE REDUCTION: This exam was performed according to the departmental dose-optimization program which includes automated exposure control, adjustment of the mA and/or kV according to patient size and/or use of iterative reconstruction technique. COMPARISON:  None Available. FINDINGS: CT HEAD FINDINGS Brain: No evidence of acute infarction, hemorrhage, hydrocephalus, extra-axial collection or mass lesion/mass effect. Vascular: No hyperdense vessel or unexpected calcification. Skull: Normal. Negative for fracture or focal lesion.  Other: Left frontal and supraorbital scalp hematoma is noted. CT MAXILLOFACIAL FINDINGS Osseous: No fracture or mandibular dislocation. No destructive process. Orbits: Negative. No traumatic or inflammatory finding. Sinuses: Clear. Soft tissues: Left frontal scalp and supraorbital hematoma is noted. Large right parotid gland mass is noted. IMPRESSION: No acute intracranial abnormality seen. Left frontal scalp and left supraorbital hematoma is noted. No osseous abnormality is noted in the maxillofacial region. Large right parotid gland mass is noted. MRI is recommended for further evaluation. Electronically Signed   By: Lupita Raider M.D.   On: 06/30/2022 17:11   CT Maxillofacial Wo Contrast  Result Date: 06/30/2022 CLINICAL DATA:  Facial injury after fall.  Loss of consciousness. EXAM: CT HEAD WITHOUT CONTRAST CT MAXILLOFACIAL WITHOUT CONTRAST TECHNIQUE: Multidetector CT imaging of the head and maxillofacial structures were performed using the standard protocol without intravenous contrast. Multiplanar CT image reconstructions of the maxillofacial structures were also generated. RADIATION DOSE  REDUCTION: This exam was performed according to the departmental dose-optimization program which includes automated exposure control, adjustment of the mA and/or kV according to patient size and/or use of iterative reconstruction technique. COMPARISON:  None Available. FINDINGS: CT HEAD FINDINGS Brain: No evidence of acute infarction, hemorrhage, hydrocephalus, extra-axial collection or mass lesion/mass effect. Vascular: No hyperdense vessel or unexpected calcification. Skull: Normal. Negative for fracture or focal lesion. Other: Left frontal and supraorbital scalp hematoma is noted. CT MAXILLOFACIAL FINDINGS Osseous: No fracture or mandibular dislocation. No destructive process. Orbits: Negative. No traumatic or inflammatory finding. Sinuses: Clear. Soft tissues: Left frontal scalp and supraorbital hematoma is noted.  Large right parotid gland mass is noted. IMPRESSION: No acute intracranial abnormality seen. Left frontal scalp and left supraorbital hematoma is noted. No osseous abnormality is noted in the maxillofacial region. Large right parotid gland mass is noted. MRI is recommended for further evaluation. Electronically Signed   By: Lupita Raider M.D.   On: 06/30/2022 17:11    Procedures Procedures    Medications Ordered in ED Medications - No data to display  ED Course/ Medical Decision Making/ A&P                           Medical Decision Making Amount and/or Complexity of Data Reviewed Labs: ordered. Radiology: ordered.   BP (!) 150/87   Pulse 73   Temp 98.3 F (36.8 C) (Oral)   Resp 15   Ht 5\' 8"  (1.727 m)   Wt 77.1 kg   SpO2 98%   BMI 25.85 kg/m   3:56 PM This is a 66 year old female presenting for evaluation of facial injury.  Patient reports she had a syncopal episode while she was in bed 4 days ago and perhaps may have falling off her bed striking her left forehead against a hard object.  She does not remember exactly what happened but states she slept throughout most of the day. She noticed bruising and swelling to her left orbit since.  She reached out to her PCP with concerns of concussion but denies having any lightheadedness or dizziness no nausea no vomiting no confusion no focal numbness or focal weakness or neck pain.  She is not on any blood thinner medication.  She did recall having gout and was taken allopurinol and colchicine the day before her accident.  She discontinued the medication.  She does not have any history of seizure.  On exam, this is a well-appearing female appears to be in no acute discomfort.  She does have a moderate size ecchymosis noted to her left eyebrow and ecchymosis to her left orbital region without any restriction of eye movement or visual changes.  Her pupils were round equal and reactive and extraocular movement are intact.  I am unsure  what caused her syncopal episode.  Given her age, will obtain head and maxillofacial CT scan to rule out any orbital bone injury.  5:50 PM Labs, EKG, and imaging independently viewed interpreted by me and I agree with radiology interpretation.  Labs are reassuring, normal WBC, normal H&H, electrolyte panels are reassuring, urinalysis without signs of urinary tract infection, EKG normal sinus rhythm without concerning arrhythmia or ischemic changes.  CT scan of her head and maxillofacial shows no acute intracranial abnormalities.  Evidence of left frontal scalp and left supraorbital hematoma were noted consistent with finding on exam.  She does have a large right parotid gland mass and MRI is recommended for further  evaluation.  This can be done outpatient.  Discussed this finding with patient who will follow up appropriately.  Otherwise at this time patient is stable to be discharged. I have considered PE in my differential but felt less likely.  This patient presents to the ED for concern of syncope, this involves an extensive number of treatment options, and is a complaint that carries with it a high risk of complications and morbidity.  The differential diagnosis includes cardiac arrhythmia, anemia, dehydration, stroke, acs, vasovagal, PE  Co morbidities that complicate the patient evaluation HTN Additional history obtained:  Additional history obtained from patient External records from outside source obtained and reviewed including EMR including labs and imaging  Lab Tests:  I Ordered, and personally interpreted labs.  The pertinent results include:  as above  Imaging Studies ordered:  I ordered imaging studies including head/maxillofacial CT I independently visualized and interpreted imaging which showed no acute intracranial abnormality, no broken bones I agree with the radiologist interpretation  Cardiac Monitoring:  The patient was maintained on a cardiac monitor.  I personally  viewed and interpreted the cardiac monitored which showed an underlying rhythm of: NSR    Test Considered: as above  Critical Interventions: none  Consultations Obtained:  I requested consultation with the attending Dr. Hal Morales,  and discussed lab and imaging findings as well as pertinent plan - they recommend: outpt f/u  Problem List / ED Course: syncope  Facial hematoma  Incidental R parotid mass on CT  Reevaluation:  After the interventions noted above, I reevaluated the patient and found that they have :stayed the same  Social Determinants of Health: tobacco use  Dispostion:  After consideration of the diagnostic results and the patients response to treatment, I feel that the patent would benefit from outpt f/u.         Final Clinical Impression(s) / ED Diagnoses Final diagnoses:  Contusion of left orbit, initial encounter  Mass of right parotid gland  Traumatic subconjunctival hemorrhage of left eye    Rx / DC Orders ED Discharge Orders     None         Fayrene Helper, PA-C 06/30/22 1759    Ernie Avena, MD 06/30/22 2227

## 2022-06-30 NOTE — ED Notes (Signed)
Patient transported to radiology

## 2022-06-30 NOTE — ED Notes (Signed)
Pt's CBG result was 105. Informed Lil - RN.

## 2022-06-30 NOTE — Discharge Instructions (Signed)
You have been evaluated for your injury.  Fortunately CT scan did not show any blood in your brain or any significant broken bone.  Please apply ice ice pack to the bruising near your left eye for comfort.  Take over-the-counter Tylenol or ibuprofen as needed for pain.  Incidentally CT scan shows a mass in your right parotid gland.  Discussed this with your primary care doctor at your earliest convenience and request for an MRI for further assessment.  Return if you have any concern.

## 2022-06-30 NOTE — ED Triage Notes (Signed)
Patient reports Friday she had a LOC that resulted in a fall and injury to her left eye. Patient has obvious swelling to the peri-orbital area and redness to the eye. Patient reports no vision changes. Patient denies remembering a fall and reports her husband and son were picking her off the ground when she came around.

## 2022-07-02 ENCOUNTER — Telehealth: Payer: Self-pay

## 2022-07-02 NOTE — Telephone Encounter (Signed)
Transition Care Management Follow-up Telephone Call Date of discharge and from where: 06/30/2022 [place] [facility]  How have you been since you were released from the hospital? Pt states she is doing okay, still experiencing headaches.  Any questions or concerns? No  Items Reviewed: Did the pt receive and understand the discharge instructions provided? Yes  Medications obtained and verified? Yes  Other? No  Any new allergies since your discharge? No  Dietary orders reviewed? Yes Do you have support at home? Yes   Home Care and Equipment/Supplies: Were home health services ordered? no If so, what is the name of the agency? N/a  Has the agency set up a time to come to the patient's home? no Were any new equipment or medical supplies ordered?  No What is the name of the medical supply agency? N/a Were you able to get the supplies/equipment? no Do you have any questions related to the use of the equipment or supplies? No  Functional Questionnaire: (I = Independent and D = Dependent) ADLs: i  Bathing/Dressing- i  Meal Prep- i  Eating- i  Maintaining continence- i  Transferring/Ambulation- i  Managing Meds- i  Follow up appointments reviewed:  PCP Hospital f/u appt confirmed? Yes  Scheduled to see Arnette Felts on 07/06/2022  @ [facility]. Specialist Hospital f/u appt confirmed? No  Scheduled to see n/a on n/a @ n/a. Are transportation arrangements needed? No  If their condition worsens, is the pt aware to call PCP or go to the Emergency Dept.? Yes Was the patient provided with contact information for the PCP's office or ED? Yes Was to pt encouraged to call back with questions or concerns? Yes

## 2022-07-03 ENCOUNTER — Other Ambulatory Visit: Payer: Self-pay | Admitting: Nurse Practitioner

## 2022-07-03 DIAGNOSIS — R9089 Other abnormal findings on diagnostic imaging of central nervous system: Secondary | ICD-10-CM

## 2022-07-06 ENCOUNTER — Encounter: Payer: Self-pay | Admitting: Nurse Practitioner

## 2022-07-06 ENCOUNTER — Ambulatory Visit (INDEPENDENT_AMBULATORY_CARE_PROVIDER_SITE_OTHER): Payer: Medicare PPO | Admitting: Nurse Practitioner

## 2022-07-06 VITALS — BP 110/68 | HR 64 | Temp 98.7°F | Ht 68.0 in | Wt 167.4 lb

## 2022-07-06 DIAGNOSIS — R55 Syncope and collapse: Secondary | ICD-10-CM

## 2022-07-06 DIAGNOSIS — K118 Other diseases of salivary glands: Secondary | ICD-10-CM

## 2022-07-06 DIAGNOSIS — R9089 Other abnormal findings on diagnostic imaging of central nervous system: Secondary | ICD-10-CM

## 2022-07-06 DIAGNOSIS — S0990XD Unspecified injury of head, subsequent encounter: Secondary | ICD-10-CM

## 2022-07-06 DIAGNOSIS — D649 Anemia, unspecified: Secondary | ICD-10-CM

## 2022-07-06 DIAGNOSIS — Z23 Encounter for immunization: Secondary | ICD-10-CM | POA: Diagnosis not present

## 2022-07-06 NOTE — Patient Instructions (Signed)

## 2022-07-06 NOTE — Progress Notes (Signed)
Hershal Coria Martin,acting as a Neurosurgeon for Arnette Felts, FNP.,have documented all relevant documentation on the behalf of Arnette Felts, FNP,as directed by  Arnette Felts, FNP while in the presence of Arnette Felts, FNP.    Subjective:     Patient ID: Virginia Robles , female    DOB: Oct 07, 1956 , 66 y.o.   MRN: [id number]   Chief Complaint  Patient presents with   Referral    HPI  Patient presents today for a referral for a MRI. The last thing she remembered she was in the bed. She reports her son and husband heard her hit the floor. She says her son she would not wake up. When her sister got to her home at 3p when she was waking up. She had been laying around the day before. Denies a previous history of seizures. Patient states she fell September 1st and they did a CT scan at the hospital and found a mass on the right side of her neck. She went to the ER for evaluation on 06/30/22 and was discharged the same day.      Past Medical History:  Diagnosis Date   Hypertension    Ulcerative colitis      Family History  Problem Relation Age of Onset   Hypertension Mother    Cancer Mother    Diabetes Father    Stroke Sister    Hypertension Sister    Hyperlipidemia Sister    Cancer Sister    Irritable bowel syndrome Sister    Breast cancer Niece 14     Current Outpatient Medications:    allopurinol (ZYLOPRIM) 100 MG tablet, Take 1 tablet (100 mg total) by mouth daily., Disp: 30 tablet, Rfl: 2   colchicine 0.6 MG tablet, Take 1 tablet (0.6 mg total) by mouth daily. As needed, Disp: 30 tablet, Rfl: 2   diclofenac sodium (VOLTAREN) 1 % GEL, Apply 2 g topically 4 (four) times daily., Disp: 50 g, Rfl: 1   lisinopril (ZESTRIL) 40 MG tablet, TAKE 1 TABLET EVERY DAY, Disp: 90 tablet, Rfl: 0   mesalamine (LIALDA) 1.2 g EC tablet, Take 1.2 g by mouth daily with breakfast. Patient taking as needed, Disp: , Rfl:    Multiple Vitamins-Minerals (MULTIVITAMIN ADULT PO), Take 1 tablet by mouth  daily., Disp: , Rfl:    Allergies  Allergen Reactions   Elemental Sulfur Hives   Penicillins Hives     Review of Systems  Constitutional: Negative.   Respiratory: Negative.    Cardiovascular: Negative.   Gastrointestinal: Negative.   Musculoskeletal:  Positive for joint swelling.  Neurological: Negative.      Today's Vitals   07/06/22 0926  BP: 110/68  Pulse: 64  Temp: 98.7 F (37.1 C)  TempSrc: Oral  Weight: 167 lb 6.4 oz (75.9 kg)  Height: 5\' 8"  (1.727 m)  PainSc: 3   PainLoc: Head   Body mass index is 25.45 kg/m.   Objective:  Physical Exam Vitals reviewed.  Constitutional:      General: She is not in acute distress.    Appearance: Normal appearance.  Cardiovascular:     Rate and Rhythm: Normal rate and regular rhythm.     Pulses: Normal pulses.     Heart sounds: Normal heart sounds. No murmur heard. Pulmonary:     Effort: Pulmonary effort is normal. No respiratory distress.     Breath sounds: No wheezing.  Skin:    General: Skin is warm and dry.     Findings: Bruising (  around left eye and hematoma present above eyebrow) present.  Neurological:     General: No focal deficit present.     Mental Status: She is alert and oriented to person, place, and time.     Cranial Nerves: No cranial nerve deficit.     Motor: No weakness.  Psychiatric:        Mood and Affect: Mood normal.        Behavior: Behavior normal.        Thought Content: Thought content normal.        Judgment: Judgment normal.         Assessment And Plan:     1. Syncope and collapse Comments: Unsure of the cause of her syncope, CT scan negative for stroke. Did have incidental finding of parortid gland mass. - TSH  2. Injury of head, subsequent encounter Comments: Soft hematoma present above left eye with bruising around left eye. Continue Tylenol as needed and ice packs.   3. Abnormal CT of brain Comments: Incidental mass seen to right parotid gland  4. Mass of right parotid  gland Comments: Incidental finding on CT of head with syncopal episode. Will check MRI pending insurance approval  5. Low hemoglobin Comments: Hgb slightly low at 11.5, will check iron levels. Denies shortness of breath - Iron, TIBC and Ferritin Panel - TSH  6. Need for influenza vaccination Influenza vaccine administered Encouraged to take Tylenol as needed for fever or muscle aches. - Flu Vaccine QUAD High Dose(Fluad)     Patient was given opportunity to ask questions. Patient verbalized understanding of the plan and was able to repeat key elements of the plan. All questions were answered to their satisfaction.  Arnette Felts, FNP   I, Arnette Felts, FNP, have reviewed all documentation for this visit. The documentation on 07/06/22 for the exam, diagnosis, procedures, and orders are all accurate and complete.   IF YOU HAVE BEEN REFERRED TO A SPECIALIST, IT MAY TAKE 1-2 WEEKS TO SCHEDULE/PROCESS THE REFERRAL. IF YOU HAVE NOT HEARD FROM US/SPECIALIST IN TWO WEEKS, PLEASE GIVE [place] A CALL AT [phone].   THE PATIENT IS ENCOURAGED TO PRACTICE SOCIAL DISTANCING DUE TO THE COVID-19 PANDEMIC.

## 2022-07-07 LAB — IRON,TIBC AND FERRITIN PANEL
Ferritin: 550 ng/mL — ABNORMAL HIGH (ref 15–150)
Iron Saturation: 19 % (ref 15–55)
Iron: 62 ug/dL (ref 27–139)
Total Iron Binding Capacity: 324 ug/dL (ref 250–450)
UIBC: 262 ug/dL (ref 118–369)

## 2022-07-07 LAB — TSH: TSH: 0.873 u[IU]/mL (ref 0.450–4.500)

## 2022-07-16 ENCOUNTER — Encounter: Payer: Medicare PPO | Admitting: Nurse Practitioner

## 2022-07-20 ENCOUNTER — Ambulatory Visit
Admission: RE | Admit: 2022-07-20 | Discharge: 2022-07-20 | Disposition: A | Discharge status: Home / Self Care | Payer: Medicare PPO | Source: Ambulatory Visit | Attending: Nurse Practitioner | Admitting: Nurse Practitioner

## 2022-07-20 DIAGNOSIS — S0093XA Contusion of unspecified part of head, initial encounter: Secondary | ICD-10-CM | POA: Diagnosis not present

## 2022-07-20 DIAGNOSIS — J341 Cyst and mucocele of nose and nasal sinus: Secondary | ICD-10-CM | POA: Diagnosis not present

## 2022-07-20 DIAGNOSIS — I6782 Cerebral ischemia: Secondary | ICD-10-CM | POA: Diagnosis not present

## 2022-07-20 DIAGNOSIS — K118 Other diseases of salivary glands: Secondary | ICD-10-CM | POA: Diagnosis not present

## 2022-07-20 DIAGNOSIS — R9089 Other abnormal findings on diagnostic imaging of central nervous system: Secondary | ICD-10-CM

## 2022-08-11 DIAGNOSIS — H04123 Dry eye syndrome of bilateral lacrimal glands: Secondary | ICD-10-CM | POA: Diagnosis not present

## 2022-08-11 DIAGNOSIS — H43813 Vitreous degeneration, bilateral: Secondary | ICD-10-CM | POA: Diagnosis not present

## 2022-08-11 DIAGNOSIS — H2513 Age-related nuclear cataract, bilateral: Secondary | ICD-10-CM | POA: Diagnosis not present

## 2022-08-11 DIAGNOSIS — H5213 Myopia, bilateral: Secondary | ICD-10-CM | POA: Diagnosis not present

## 2022-08-25 ENCOUNTER — Other Ambulatory Visit: Payer: Self-pay | Admitting: Gastroenterology

## 2022-08-25 DIAGNOSIS — R634 Abnormal weight loss: Secondary | ICD-10-CM

## 2022-08-25 DIAGNOSIS — K5190 Ulcerative colitis, unspecified, without complications: Secondary | ICD-10-CM | POA: Diagnosis not present

## 2022-08-25 DIAGNOSIS — R630 Anorexia: Secondary | ICD-10-CM | POA: Diagnosis not present

## 2022-08-25 DIAGNOSIS — Z8379 Family history of other diseases of the digestive system: Secondary | ICD-10-CM | POA: Diagnosis not present

## 2022-09-08 ENCOUNTER — Ambulatory Visit
Admission: RE | Admit: 2022-09-08 | Discharge: 2022-09-08 | Disposition: A | Discharge status: Home / Self Care | Payer: Medicare PPO | Source: Ambulatory Visit | Attending: Gastroenterology | Admitting: Gastroenterology

## 2022-09-08 DIAGNOSIS — R140 Abdominal distension (gaseous): Secondary | ICD-10-CM | POA: Diagnosis not present

## 2022-09-08 DIAGNOSIS — K654 Sclerosing mesenteritis: Secondary | ICD-10-CM | POA: Diagnosis not present

## 2022-09-08 DIAGNOSIS — R634 Abnormal weight loss: Secondary | ICD-10-CM | POA: Diagnosis not present

## 2022-09-08 DIAGNOSIS — R933 Abnormal findings on diagnostic imaging of other parts of digestive tract: Secondary | ICD-10-CM | POA: Diagnosis not present

## 2022-09-08 DIAGNOSIS — K5190 Ulcerative colitis, unspecified, without complications: Secondary | ICD-10-CM | POA: Diagnosis not present

## 2022-09-08 MED ORDER — IOPAMIDOL (ISOVUE-300) INJECTION 61%
100.0000 mL | Freq: Once | INTRAVENOUS | Status: AC | PRN
  Administered 2022-09-08: 100 mL via INTRAVENOUS

## 2022-09-09 ENCOUNTER — Other Ambulatory Visit (HOSPITAL_COMMUNITY): Payer: Self-pay | Admitting: Gastroenterology

## 2022-09-09 DIAGNOSIS — R935 Abnormal findings on diagnostic imaging of other abdominal regions, including retroperitoneum: Secondary | ICD-10-CM

## 2022-09-10 ENCOUNTER — Other Ambulatory Visit: Payer: Medicare PPO

## 2022-09-30 DIAGNOSIS — R600 Localized edema: Secondary | ICD-10-CM | POA: Diagnosis not present

## 2022-09-30 DIAGNOSIS — E663 Overweight: Secondary | ICD-10-CM | POA: Diagnosis not present

## 2022-09-30 DIAGNOSIS — Z800 Family history of malignant neoplasm of digestive organs: Secondary | ICD-10-CM | POA: Diagnosis not present

## 2022-09-30 DIAGNOSIS — I10 Essential (primary) hypertension: Secondary | ICD-10-CM | POA: Diagnosis not present

## 2022-09-30 DIAGNOSIS — D649 Anemia, unspecified: Secondary | ICD-10-CM | POA: Diagnosis not present

## 2022-09-30 DIAGNOSIS — I700 Atherosclerosis of aorta: Secondary | ICD-10-CM | POA: Diagnosis not present

## 2022-09-30 DIAGNOSIS — K5190 Ulcerative colitis, unspecified, without complications: Secondary | ICD-10-CM | POA: Diagnosis not present

## 2022-09-30 DIAGNOSIS — D692 Other nonthrombocytopenic purpura: Secondary | ICD-10-CM | POA: Diagnosis not present

## 2022-10-02 ENCOUNTER — Ambulatory Visit (HOSPITAL_COMMUNITY)
Admission: RE | Admit: 2022-10-02 | Discharge: 2022-10-02 | Disposition: A | Discharge status: Home / Self Care | Payer: Medicare PPO | Source: Ambulatory Visit | Attending: Gastroenterology | Admitting: Gastroenterology

## 2022-10-02 DIAGNOSIS — R935 Abnormal findings on diagnostic imaging of other abdominal regions, including retroperitoneum: Secondary | ICD-10-CM | POA: Diagnosis not present

## 2022-10-02 LAB — GLUCOSE, CAPILLARY: Glucose-Capillary: 88 mg/dL (ref 70–99)

## 2022-10-02 MED ORDER — FLUDEOXYGLUCOSE F - 18 (FDG) INJECTION
7.9000 | Freq: Once | INTRAVENOUS | Status: AC
  Administered 2022-10-02: 7.89 via INTRAVENOUS

## 2022-10-03 ENCOUNTER — Other Ambulatory Visit: Payer: Self-pay | Admitting: Nurse Practitioner

## 2022-10-15 DIAGNOSIS — Z136 Encounter for screening for cardiovascular disorders: Secondary | ICD-10-CM | POA: Diagnosis not present

## 2022-10-15 DIAGNOSIS — D649 Anemia, unspecified: Secondary | ICD-10-CM | POA: Diagnosis not present

## 2022-11-03 ENCOUNTER — Encounter: Payer: Medicare PPO | Admitting: Nurse Practitioner

## 2022-12-02 ENCOUNTER — Ambulatory Visit: Payer: Medicare PPO

## 2022-12-07 ENCOUNTER — Telehealth: Payer: Self-pay | Admitting: Nurse Practitioner

## 2022-12-07 NOTE — Telephone Encounter (Signed)
Left message for patient to call back and schedule Medicare Annual Wellness Visit (AWV) either virtually or in office. Left  my Herbie Drape number [phone]   Last AWV 11/19/21 please schedule with Nurse Health Adviser   45 min for awv-i  in office appointments 30 min for awv-s & awv-i phone/virtual appointments

## 2023-01-05 DIAGNOSIS — D649 Anemia, unspecified: Secondary | ICD-10-CM | POA: Diagnosis not present

## 2023-01-05 DIAGNOSIS — Z136 Encounter for screening for cardiovascular disorders: Secondary | ICD-10-CM | POA: Diagnosis not present

## 2023-01-07 ENCOUNTER — Other Ambulatory Visit: Payer: Self-pay | Admitting: Internal Medicine

## 2023-01-07 DIAGNOSIS — R935 Abnormal findings on diagnostic imaging of other abdominal regions, including retroperitoneum: Secondary | ICD-10-CM

## 2023-01-07 DIAGNOSIS — R600 Localized edema: Secondary | ICD-10-CM | POA: Diagnosis not present

## 2023-01-07 DIAGNOSIS — R945 Abnormal results of liver function studies: Secondary | ICD-10-CM | POA: Diagnosis not present

## 2023-01-07 DIAGNOSIS — E785 Hyperlipidemia, unspecified: Secondary | ICD-10-CM | POA: Diagnosis not present

## 2023-01-07 DIAGNOSIS — I10 Essential (primary) hypertension: Secondary | ICD-10-CM | POA: Diagnosis not present

## 2023-01-07 DIAGNOSIS — Z800 Family history of malignant neoplasm of digestive organs: Secondary | ICD-10-CM | POA: Diagnosis not present

## 2023-01-07 DIAGNOSIS — D692 Other nonthrombocytopenic purpura: Secondary | ICD-10-CM | POA: Diagnosis not present

## 2023-01-07 DIAGNOSIS — E663 Overweight: Secondary | ICD-10-CM | POA: Diagnosis not present

## 2023-01-07 DIAGNOSIS — K5190 Ulcerative colitis, unspecified, without complications: Secondary | ICD-10-CM | POA: Diagnosis not present

## 2023-01-22 DIAGNOSIS — K118 Other diseases of salivary glands: Secondary | ICD-10-CM | POA: Diagnosis not present

## 2023-02-10 ENCOUNTER — Ambulatory Visit
Admission: RE | Admit: 2023-02-10 | Discharge: 2023-02-10 | Disposition: A | Discharge status: Home / Self Care | Payer: Medicare PPO | Source: Ambulatory Visit | Attending: Internal Medicine | Admitting: Internal Medicine

## 2023-02-10 DIAGNOSIS — I700 Atherosclerosis of aorta: Secondary | ICD-10-CM | POA: Diagnosis not present

## 2023-02-10 DIAGNOSIS — R935 Abnormal findings on diagnostic imaging of other abdominal regions, including retroperitoneum: Secondary | ICD-10-CM

## 2023-02-10 DIAGNOSIS — R590 Localized enlarged lymph nodes: Secondary | ICD-10-CM | POA: Diagnosis not present

## 2023-02-10 DIAGNOSIS — Z90710 Acquired absence of both cervix and uterus: Secondary | ICD-10-CM | POA: Diagnosis not present

## 2023-02-10 DIAGNOSIS — K760 Fatty (change of) liver, not elsewhere classified: Secondary | ICD-10-CM | POA: Diagnosis not present

## 2023-02-10 MED ORDER — IOPAMIDOL (ISOVUE-300) INJECTION 61%
100.0000 mL | Freq: Once | INTRAVENOUS | Status: AC | PRN
  Administered 2023-02-10: 100 mL via INTRAVENOUS

## 2023-02-16 ENCOUNTER — Telehealth: Payer: Self-pay | Admitting: Nurse Practitioner

## 2023-02-16 NOTE — Telephone Encounter (Signed)
Called patient to schedule Medicare Annual Wellness Visit (AWV). Left message for patient to call back and schedule Medicare Annual Wellness Visit (AWV).  Last date of AWV: 11/19/21  Please schedule an appointment at any time with [facility].  If any questions, please contact me at [phone].  Thank you ,  Jamond Neels [facility] AWV direct phone # [phone] 

## 2023-02-17 NOTE — Telephone Encounter (Signed)
Patient left VM returning call.

## 2023-02-19 ENCOUNTER — Other Ambulatory Visit: Payer: Self-pay | Admitting: Internal Medicine

## 2023-02-19 DIAGNOSIS — R590 Localized enlarged lymph nodes: Secondary | ICD-10-CM

## 2023-03-04 ENCOUNTER — Telehealth: Payer: Self-pay

## 2023-03-04 DIAGNOSIS — K5190 Ulcerative colitis, unspecified, without complications: Secondary | ICD-10-CM | POA: Diagnosis not present

## 2023-03-04 DIAGNOSIS — F325 Major depressive disorder, single episode, in full remission: Secondary | ICD-10-CM | POA: Diagnosis not present

## 2023-03-04 DIAGNOSIS — N181 Chronic kidney disease, stage 1: Secondary | ICD-10-CM | POA: Diagnosis not present

## 2023-03-04 DIAGNOSIS — Z9181 History of falling: Secondary | ICD-10-CM | POA: Diagnosis not present

## 2023-03-04 DIAGNOSIS — Z809 Family history of malignant neoplasm, unspecified: Secondary | ICD-10-CM | POA: Diagnosis not present

## 2023-03-04 DIAGNOSIS — K219 Gastro-esophageal reflux disease without esophagitis: Secondary | ICD-10-CM | POA: Diagnosis not present

## 2023-03-04 DIAGNOSIS — I129 Hypertensive chronic kidney disease with stage 1 through stage 4 chronic kidney disease, or unspecified chronic kidney disease: Secondary | ICD-10-CM | POA: Diagnosis not present

## 2023-03-04 DIAGNOSIS — M1030 Gout due to renal impairment, unspecified site: Secondary | ICD-10-CM | POA: Diagnosis not present

## 2023-03-04 DIAGNOSIS — F419 Anxiety disorder, unspecified: Secondary | ICD-10-CM | POA: Diagnosis not present

## 2023-03-04 NOTE — Telephone Encounter (Signed)
This nurse attempted to call patient to schedule or perform AWV. Message left to call office in order to schedule appointment.

## 2023-03-30 DIAGNOSIS — R945 Abnormal results of liver function studies: Secondary | ICD-10-CM | POA: Diagnosis not present

## 2023-03-30 DIAGNOSIS — Z131 Encounter for screening for diabetes mellitus: Secondary | ICD-10-CM | POA: Diagnosis not present

## 2023-03-30 DIAGNOSIS — D649 Anemia, unspecified: Secondary | ICD-10-CM | POA: Diagnosis not present

## 2023-03-30 DIAGNOSIS — E785 Hyperlipidemia, unspecified: Secondary | ICD-10-CM | POA: Diagnosis not present

## 2023-03-30 DIAGNOSIS — E790 Hyperuricemia without signs of inflammatory arthritis and tophaceous disease: Secondary | ICD-10-CM | POA: Diagnosis not present

## 2023-04-02 DIAGNOSIS — I700 Atherosclerosis of aorta: Secondary | ICD-10-CM | POA: Diagnosis not present

## 2023-04-02 DIAGNOSIS — E785 Hyperlipidemia, unspecified: Secondary | ICD-10-CM | POA: Diagnosis not present

## 2023-04-02 DIAGNOSIS — R935 Abnormal findings on diagnostic imaging of other abdominal regions, including retroperitoneum: Secondary | ICD-10-CM | POA: Diagnosis not present

## 2023-04-02 DIAGNOSIS — Z0001 Encounter for general adult medical examination with abnormal findings: Secondary | ICD-10-CM | POA: Diagnosis not present

## 2023-04-02 DIAGNOSIS — Z136 Encounter for screening for cardiovascular disorders: Secondary | ICD-10-CM | POA: Diagnosis not present

## 2023-04-02 DIAGNOSIS — R945 Abnormal results of liver function studies: Secondary | ICD-10-CM | POA: Diagnosis not present

## 2023-04-02 DIAGNOSIS — Z13820 Encounter for screening for osteoporosis: Secondary | ICD-10-CM | POA: Diagnosis not present

## 2023-04-02 DIAGNOSIS — Z1211 Encounter for screening for malignant neoplasm of colon: Secondary | ICD-10-CM | POA: Diagnosis not present

## 2023-04-02 DIAGNOSIS — Z23 Encounter for immunization: Secondary | ICD-10-CM | POA: Diagnosis not present

## 2023-04-05 ENCOUNTER — Other Ambulatory Visit: Payer: Self-pay | Admitting: Internal Medicine

## 2023-04-05 DIAGNOSIS — R945 Abnormal results of liver function studies: Secondary | ICD-10-CM

## 2023-04-08 ENCOUNTER — Ambulatory Visit
Admission: RE | Admit: 2023-04-08 | Discharge: 2023-04-08 | Disposition: A | Discharge status: Home / Self Care | Payer: Medicare PPO | Source: Ambulatory Visit | Attending: Internal Medicine | Admitting: Internal Medicine

## 2023-04-08 DIAGNOSIS — K7689 Other specified diseases of liver: Secondary | ICD-10-CM | POA: Diagnosis not present

## 2023-04-08 DIAGNOSIS — R945 Abnormal results of liver function studies: Secondary | ICD-10-CM | POA: Diagnosis not present

## 2023-04-20 DIAGNOSIS — R748 Abnormal levels of other serum enzymes: Secondary | ICD-10-CM | POA: Diagnosis not present

## 2023-04-20 DIAGNOSIS — I10 Essential (primary) hypertension: Secondary | ICD-10-CM | POA: Diagnosis not present

## 2023-04-20 DIAGNOSIS — K5190 Ulcerative colitis, unspecified, without complications: Secondary | ICD-10-CM | POA: Diagnosis not present

## 2023-04-20 DIAGNOSIS — M109 Gout, unspecified: Secondary | ICD-10-CM | POA: Diagnosis not present

## 2023-05-04 DIAGNOSIS — R945 Abnormal results of liver function studies: Secondary | ICD-10-CM | POA: Diagnosis not present

## 2023-05-04 DIAGNOSIS — E663 Overweight: Secondary | ICD-10-CM | POA: Diagnosis not present

## 2023-05-04 DIAGNOSIS — Z6825 Body mass index (BMI) 25.0-25.9, adult: Secondary | ICD-10-CM | POA: Diagnosis not present

## 2023-05-04 DIAGNOSIS — M109 Gout, unspecified: Secondary | ICD-10-CM | POA: Diagnosis not present

## 2023-05-04 DIAGNOSIS — R768 Other specified abnormal immunological findings in serum: Secondary | ICD-10-CM | POA: Diagnosis not present

## 2023-05-04 DIAGNOSIS — H04129 Dry eye syndrome of unspecified lacrimal gland: Secondary | ICD-10-CM | POA: Diagnosis not present

## 2023-05-11 DIAGNOSIS — R768 Other specified abnormal immunological findings in serum: Secondary | ICD-10-CM | POA: Diagnosis not present

## 2023-05-11 DIAGNOSIS — M109 Gout, unspecified: Secondary | ICD-10-CM | POA: Diagnosis not present

## 2023-05-11 DIAGNOSIS — R945 Abnormal results of liver function studies: Secondary | ICD-10-CM | POA: Diagnosis not present

## 2023-05-25 DIAGNOSIS — M109 Gout, unspecified: Secondary | ICD-10-CM | POA: Diagnosis not present

## 2023-05-25 DIAGNOSIS — R945 Abnormal results of liver function studies: Secondary | ICD-10-CM | POA: Diagnosis not present

## 2023-05-25 DIAGNOSIS — H04129 Dry eye syndrome of unspecified lacrimal gland: Secondary | ICD-10-CM | POA: Diagnosis not present

## 2023-05-25 DIAGNOSIS — R768 Other specified abnormal immunological findings in serum: Secondary | ICD-10-CM | POA: Diagnosis not present

## 2023-05-25 DIAGNOSIS — Z6824 Body mass index (BMI) 24.0-24.9, adult: Secondary | ICD-10-CM | POA: Diagnosis not present

## 2023-06-15 DIAGNOSIS — Z1231 Encounter for screening mammogram for malignant neoplasm of breast: Secondary | ICD-10-CM | POA: Diagnosis not present

## 2023-06-22 DIAGNOSIS — Z8379 Family history of other diseases of the digestive system: Secondary | ICD-10-CM | POA: Diagnosis not present

## 2023-06-22 DIAGNOSIS — R748 Abnormal levels of other serum enzymes: Secondary | ICD-10-CM | POA: Diagnosis not present

## 2023-06-22 DIAGNOSIS — I10 Essential (primary) hypertension: Secondary | ICD-10-CM | POA: Diagnosis not present

## 2023-06-22 DIAGNOSIS — K5190 Ulcerative colitis, unspecified, without complications: Secondary | ICD-10-CM | POA: Diagnosis not present

## 2023-06-22 DIAGNOSIS — M109 Gout, unspecified: Secondary | ICD-10-CM | POA: Diagnosis not present

## 2023-06-29 ENCOUNTER — Other Ambulatory Visit: Payer: Self-pay | Admitting: Gastroenterology

## 2023-06-29 DIAGNOSIS — R7989 Other specified abnormal findings of blood chemistry: Secondary | ICD-10-CM

## 2023-07-02 ENCOUNTER — Ambulatory Visit
Admission: RE | Admit: 2023-07-02 | Discharge: 2023-07-02 | Disposition: A | Discharge status: Home / Self Care | Payer: Medicare PPO | Source: Ambulatory Visit | Attending: Gastroenterology | Admitting: Gastroenterology

## 2023-07-02 DIAGNOSIS — R7989 Other specified abnormal findings of blood chemistry: Secondary | ICD-10-CM

## 2023-07-21 ENCOUNTER — Encounter: Payer: Self-pay | Admitting: Internal Medicine

## 2023-08-06 ENCOUNTER — Other Ambulatory Visit: Payer: Self-pay | Admitting: Urology

## 2023-08-06 DIAGNOSIS — D3001 Benign neoplasm of right kidney: Secondary | ICD-10-CM

## 2023-08-09 ENCOUNTER — Telehealth: Payer: Self-pay

## 2023-08-09 MED ORDER — PREDNISONE 50 MG PO TABS: ORAL_TABLET | ORAL | 0 refills | Status: DC

## 2023-08-09 NOTE — Telephone Encounter (Signed)
Phone call to patient to review instructions for 13 hr prep for CT w/ contrast on 08/13/23  at 9:00AM. Prescription called into [facility]. Pt aware and verbalized understanding of instructions. Prescription: 08/12/23 @8 :00PM- 50mg  Prednisone 08/13/23 @2 :00AM 50mg  Prednisone 08/13/23 @8 :00AM - 50mg  Prednisone and 50mg  Benadryl

## 2023-08-13 ENCOUNTER — Inpatient Hospital Stay: Admission: RE | Admit: 2023-08-13 | Payer: Medicare PPO | Source: Ambulatory Visit

## 2023-08-13 ENCOUNTER — Ambulatory Visit
Admission: RE | Admit: 2023-08-13 | Discharge: 2023-08-13 | Disposition: A | Discharge status: Home / Self Care | Payer: Medicare PPO | Source: Ambulatory Visit | Attending: Urology

## 2023-08-13 DIAGNOSIS — D3001 Benign neoplasm of right kidney: Secondary | ICD-10-CM

## 2023-08-13 MED ORDER — IOPAMIDOL (ISOVUE-300) INJECTION 61%
500.0000 mL | Freq: Once | INTRAVENOUS | Status: AC | PRN
  Administered 2023-08-13: 85 mL via INTRAVENOUS

## 2024-11-14 ENCOUNTER — Other Ambulatory Visit: Payer: Self-pay | Admitting: Urology

## 2024-11-14 DIAGNOSIS — D4100 Neoplasm of uncertain behavior of unspecified kidney: Secondary | ICD-10-CM

## 2024-11-15 ENCOUNTER — Encounter: Payer: Self-pay | Admitting: Urology

## 2024-12-10 ENCOUNTER — Other Ambulatory Visit
